# Patient Record
Sex: Female | Born: 1960 | Race: Black or African American | Hispanic: No | Marital: Married | Smoking: Never smoker
Health system: Southern US, Community
[De-identification: ages and names within clinical notes are randomized; demographics above are authoritative.]

## PROBLEM LIST (undated history)

## (undated) DIAGNOSIS — T7840XA Allergy, unspecified, initial encounter: Secondary | ICD-10-CM

## (undated) DIAGNOSIS — F988 Other specified behavioral and emotional disorders with onset usually occurring in childhood and adolescence: Secondary | ICD-10-CM

## (undated) DIAGNOSIS — K0889 Other specified disorders of teeth and supporting structures: Secondary | ICD-10-CM

## (undated) DIAGNOSIS — M1990 Unspecified osteoarthritis, unspecified site: Secondary | ICD-10-CM

## (undated) DIAGNOSIS — I499 Cardiac arrhythmia, unspecified: Secondary | ICD-10-CM

## (undated) DIAGNOSIS — D649 Anemia, unspecified: Secondary | ICD-10-CM

## (undated) DIAGNOSIS — F419 Anxiety disorder, unspecified: Secondary | ICD-10-CM

## (undated) HISTORY — DX: Allergy, unspecified, initial encounter: T78.40XA

## (undated) HISTORY — PX: CHOLECYSTECTOMY: SHX55

## (undated) HISTORY — PX: FOOT SURGERY: SHX648

## (undated) HISTORY — PX: ROUX-EN-Y GASTRIC BYPASS: SHX1104

## (undated) HISTORY — PX: KNEE SURGERY: SHX244

## (undated) HISTORY — PX: HEMORROIDECTOMY: SUR656

---

## 1997-05-12 ENCOUNTER — Other Ambulatory Visit: Admission: RE | Admit: 1997-05-12 | Discharge: 1997-05-12 | Payer: Self-pay | Admitting: *Deleted

## 1997-10-30 ENCOUNTER — Emergency Department (HOSPITAL_COMMUNITY): Admission: EM | Admit: 1997-10-30 | Discharge: 1997-10-30 | Payer: Self-pay | Admitting: Emergency Medicine

## 1999-10-05 ENCOUNTER — Encounter: Payer: Self-pay | Admitting: Emergency Medicine

## 1999-10-05 ENCOUNTER — Emergency Department (HOSPITAL_COMMUNITY): Admission: EM | Admit: 1999-10-05 | Discharge: 1999-10-05 | Payer: Self-pay | Admitting: Emergency Medicine

## 1999-10-18 ENCOUNTER — Other Ambulatory Visit: Admission: RE | Admit: 1999-10-18 | Discharge: 1999-10-18 | Payer: Self-pay | Admitting: *Deleted

## 1999-11-05 ENCOUNTER — Encounter (INDEPENDENT_AMBULATORY_CARE_PROVIDER_SITE_OTHER): Payer: Self-pay | Admitting: Specialist

## 1999-11-05 ENCOUNTER — Ambulatory Visit (HOSPITAL_COMMUNITY): Admission: RE | Admit: 1999-11-05 | Discharge: 1999-11-06 | Payer: Self-pay | Admitting: General Surgery

## 1999-11-05 ENCOUNTER — Encounter (HOSPITAL_BASED_OUTPATIENT_CLINIC_OR_DEPARTMENT_OTHER): Payer: Self-pay | Admitting: General Surgery

## 2001-08-06 ENCOUNTER — Other Ambulatory Visit: Admission: RE | Admit: 2001-08-06 | Discharge: 2001-08-06 | Payer: Self-pay | Admitting: Obstetrics and Gynecology

## 2001-09-20 ENCOUNTER — Inpatient Hospital Stay (HOSPITAL_COMMUNITY): Admission: AD | Admit: 2001-09-20 | Discharge: 2001-09-20 | Payer: Self-pay | Admitting: Obstetrics and Gynecology

## 2001-10-30 ENCOUNTER — Encounter: Payer: Self-pay | Admitting: *Deleted

## 2001-10-30 ENCOUNTER — Ambulatory Visit (HOSPITAL_COMMUNITY): Admission: RE | Admit: 2001-10-30 | Discharge: 2001-10-30 | Payer: Self-pay | Admitting: *Deleted

## 2001-11-01 ENCOUNTER — Inpatient Hospital Stay (HOSPITAL_COMMUNITY): Admission: AD | Admit: 2001-11-01 | Discharge: 2001-11-04 | Payer: Self-pay | Admitting: *Deleted

## 2001-11-05 ENCOUNTER — Inpatient Hospital Stay (HOSPITAL_COMMUNITY): Admission: AD | Admit: 2001-11-05 | Discharge: 2001-11-05 | Payer: Self-pay | Admitting: *Deleted

## 2001-11-06 ENCOUNTER — Encounter (HOSPITAL_COMMUNITY): Admission: RE | Admit: 2001-11-06 | Discharge: 2001-12-06 | Payer: Self-pay | Admitting: *Deleted

## 2001-11-07 ENCOUNTER — Inpatient Hospital Stay (HOSPITAL_COMMUNITY): Admission: AD | Admit: 2001-11-07 | Discharge: 2001-11-07 | Payer: Self-pay | Admitting: *Deleted

## 2001-11-13 ENCOUNTER — Inpatient Hospital Stay (HOSPITAL_COMMUNITY): Admission: AD | Admit: 2001-11-13 | Discharge: 2001-11-15 | Payer: Self-pay | Admitting: *Deleted

## 2001-11-22 ENCOUNTER — Inpatient Hospital Stay (HOSPITAL_COMMUNITY): Admission: AD | Admit: 2001-11-22 | Discharge: 2001-11-22 | Payer: Self-pay | Admitting: *Deleted

## 2001-12-11 ENCOUNTER — Encounter (HOSPITAL_COMMUNITY): Admission: RE | Admit: 2001-12-11 | Discharge: 2002-01-10 | Payer: Self-pay | Admitting: *Deleted

## 2001-12-11 ENCOUNTER — Encounter: Payer: Self-pay | Admitting: *Deleted

## 2001-12-11 ENCOUNTER — Ambulatory Visit (HOSPITAL_COMMUNITY): Admission: RE | Admit: 2001-12-11 | Discharge: 2001-12-11 | Payer: Self-pay | Admitting: *Deleted

## 2002-01-01 ENCOUNTER — Encounter: Payer: Self-pay | Admitting: *Deleted

## 2002-01-15 ENCOUNTER — Encounter (HOSPITAL_COMMUNITY): Admission: AD | Admit: 2002-01-15 | Discharge: 2002-01-22 | Payer: Self-pay | Admitting: Obstetrics and Gynecology

## 2002-01-22 ENCOUNTER — Encounter: Payer: Self-pay | Admitting: Obstetrics and Gynecology

## 2002-01-28 ENCOUNTER — Inpatient Hospital Stay (HOSPITAL_COMMUNITY): Admission: AD | Admit: 2002-01-28 | Discharge: 2002-01-28 | Payer: Self-pay | Admitting: *Deleted

## 2002-02-04 ENCOUNTER — Inpatient Hospital Stay (HOSPITAL_COMMUNITY): Admission: AD | Admit: 2002-02-04 | Discharge: 2002-02-04 | Payer: Self-pay | Admitting: Family Medicine

## 2002-02-05 ENCOUNTER — Encounter (HOSPITAL_COMMUNITY): Admission: RE | Admit: 2002-02-05 | Discharge: 2002-02-15 | Payer: Self-pay | Admitting: *Deleted

## 2002-02-11 ENCOUNTER — Inpatient Hospital Stay (HOSPITAL_COMMUNITY): Admission: AD | Admit: 2002-02-11 | Discharge: 2002-02-11 | Payer: Self-pay | Admitting: Obstetrics and Gynecology

## 2002-02-12 ENCOUNTER — Encounter: Payer: Self-pay | Admitting: *Deleted

## 2002-02-12 ENCOUNTER — Inpatient Hospital Stay (HOSPITAL_COMMUNITY): Admission: AD | Admit: 2002-02-12 | Discharge: 2002-02-12 | Payer: Self-pay | Admitting: *Deleted

## 2002-02-15 ENCOUNTER — Inpatient Hospital Stay (HOSPITAL_COMMUNITY): Admission: AD | Admit: 2002-02-15 | Discharge: 2002-02-18 | Payer: Self-pay | Admitting: *Deleted

## 2002-02-21 ENCOUNTER — Encounter: Admission: RE | Admit: 2002-02-21 | Discharge: 2002-02-21 | Payer: Self-pay | Admitting: *Deleted

## 2002-03-29 ENCOUNTER — Encounter: Admission: RE | Admit: 2002-03-29 | Discharge: 2002-03-29 | Payer: Self-pay | Admitting: *Deleted

## 2002-03-29 ENCOUNTER — Other Ambulatory Visit: Admission: RE | Admit: 2002-03-29 | Discharge: 2002-03-29 | Payer: Self-pay | Admitting: *Deleted

## 2002-03-29 ENCOUNTER — Encounter (INDEPENDENT_AMBULATORY_CARE_PROVIDER_SITE_OTHER): Payer: Self-pay | Admitting: *Deleted

## 2003-02-04 ENCOUNTER — Emergency Department (HOSPITAL_COMMUNITY): Admission: EM | Admit: 2003-02-04 | Discharge: 2003-02-04 | Payer: Self-pay | Admitting: Emergency Medicine

## 2003-08-01 ENCOUNTER — Encounter: Admission: RE | Admit: 2003-08-01 | Discharge: 2003-08-01 | Payer: Self-pay | Admitting: Obstetrics and Gynecology

## 2003-08-01 ENCOUNTER — Encounter (INDEPENDENT_AMBULATORY_CARE_PROVIDER_SITE_OTHER): Payer: Self-pay | Admitting: *Deleted

## 2003-08-05 ENCOUNTER — Ambulatory Visit (HOSPITAL_COMMUNITY): Admission: RE | Admit: 2003-08-05 | Discharge: 2003-08-05 | Payer: Self-pay | Admitting: *Deleted

## 2003-09-12 ENCOUNTER — Encounter: Admission: RE | Admit: 2003-09-12 | Discharge: 2003-09-12 | Payer: Self-pay | Admitting: Family Medicine

## 2003-09-17 ENCOUNTER — Ambulatory Visit (HOSPITAL_COMMUNITY): Admission: RE | Admit: 2003-09-17 | Discharge: 2003-09-17 | Payer: Self-pay | Admitting: *Deleted

## 2003-09-18 ENCOUNTER — Emergency Department (HOSPITAL_COMMUNITY): Admission: EM | Admit: 2003-09-18 | Discharge: 2003-09-18 | Payer: Self-pay | Admitting: Family Medicine

## 2003-09-23 ENCOUNTER — Emergency Department (HOSPITAL_COMMUNITY): Admission: EM | Admit: 2003-09-23 | Discharge: 2003-09-23 | Payer: Self-pay | Admitting: Family Medicine

## 2003-11-03 ENCOUNTER — Emergency Department (HOSPITAL_COMMUNITY): Admission: EM | Admit: 2003-11-03 | Discharge: 2003-11-03 | Payer: Self-pay | Admitting: Family Medicine

## 2004-07-10 ENCOUNTER — Emergency Department (HOSPITAL_COMMUNITY): Admission: EM | Admit: 2004-07-10 | Discharge: 2004-07-10 | Payer: Self-pay | Admitting: Family Medicine

## 2004-08-11 ENCOUNTER — Ambulatory Visit: Payer: Self-pay | Admitting: Hematology & Oncology

## 2004-09-29 ENCOUNTER — Inpatient Hospital Stay (HOSPITAL_COMMUNITY): Admission: EM | Admit: 2004-09-29 | Discharge: 2004-10-02 | Payer: Self-pay | Admitting: Emergency Medicine

## 2004-10-08 ENCOUNTER — Ambulatory Visit: Payer: Self-pay | Admitting: Hematology & Oncology

## 2004-12-15 ENCOUNTER — Ambulatory Visit: Payer: Self-pay | Admitting: Hematology & Oncology

## 2005-03-15 ENCOUNTER — Ambulatory Visit: Payer: Self-pay | Admitting: Hematology & Oncology

## 2005-05-06 ENCOUNTER — Ambulatory Visit (HOSPITAL_COMMUNITY): Admission: RE | Admit: 2005-05-06 | Discharge: 2005-05-06 | Payer: Self-pay | Admitting: Internal Medicine

## 2005-06-12 ENCOUNTER — Ambulatory Visit: Payer: Self-pay | Admitting: Hematology & Oncology

## 2005-06-17 LAB — CHCC SMEAR

## 2005-06-17 LAB — CBC & DIFF AND RETIC
EOS%: 1.1 % (ref 0.0–7.0)
Eosinophils Absolute: 0 10*3/uL (ref 0.0–0.5)
HGB: 13.6 g/dL (ref 11.6–15.9)
IRF: 0.27 (ref 0.130–0.330)
MCH: 27.5 pg (ref 26.0–34.0)
MCV: 83.8 fL (ref 81.0–101.0)
MONO%: 9.8 % (ref 0.0–13.0)
NEUT#: 1.9 10*3/uL (ref 1.5–6.5)
RBC: 4.94 10*6/uL (ref 3.70–5.32)
RDW: 14 % (ref 11.3–14.5)
RETIC #: 45 10*3/uL (ref 19.7–115.1)
Retic %: 0.9 % (ref 0.4–2.3)
lymph#: 1.9 10*3/uL (ref 0.9–3.3)

## 2005-09-15 ENCOUNTER — Ambulatory Visit: Payer: Self-pay | Admitting: Hematology & Oncology

## 2005-09-16 LAB — CBC WITH DIFFERENTIAL/PLATELET
BASO%: 0.3 % (ref 0.0–2.0)
EOS%: 1.1 % (ref 0.0–7.0)
LYMPH%: 39.3 % (ref 14.0–48.0)
MCHC: 32.7 g/dL (ref 32.0–36.0)
MONO#: 0.4 10*3/uL (ref 0.1–0.9)
Platelets: 220 10*3/uL (ref 145–400)
RBC: 4.84 10*6/uL (ref 3.70–5.32)
WBC: 4.3 10*3/uL (ref 3.9–10.0)

## 2005-09-16 LAB — CHCC SMEAR

## 2005-09-19 LAB — VITAMIN B12: Vitamin B-12: >2000 pg/mL — ABNORMAL HIGH (ref 211–911)

## 2005-09-19 LAB — TSH: TSH: 1.623 u[IU]/mL (ref 0.350–5.500)

## 2005-12-14 ENCOUNTER — Ambulatory Visit: Payer: Self-pay | Admitting: Hematology & Oncology

## 2005-12-16 LAB — CBC WITH DIFFERENTIAL/PLATELET
BASO%: 0.3 % (ref 0.0–2.0)
HCT: 41.1 % (ref 34.8–46.6)
MCH: 27.5 pg (ref 26.0–34.0)
MCHC: 32.8 g/dL (ref 32.0–36.0)
MCV: 83.6 fL (ref 81.0–101.0)
MONO#: 0.3 10*3/uL (ref 0.1–0.9)
MONO%: 7.8 % (ref 0.0–13.0)
NEUT#: 2.3 10*3/uL (ref 1.5–6.5)
NEUT%: 52.5 % (ref 39.6–76.8)
Platelets: 217 10*3/uL (ref 145–400)
lymph#: 1.7 10*3/uL (ref 0.9–3.3)

## 2005-12-19 LAB — TRANSFERRIN RECEPTOR, SOLUABLE: Transferrin Receptor, Soluble: 2.5 mg/L (ref 1.9–4.4)

## 2006-04-01 ENCOUNTER — Emergency Department (HOSPITAL_COMMUNITY): Admission: EM | Admit: 2006-04-01 | Discharge: 2006-04-01 | Payer: Self-pay | Admitting: Family Medicine

## 2006-04-11 ENCOUNTER — Ambulatory Visit: Payer: Self-pay | Admitting: Hematology & Oncology

## 2006-09-07 IMAGING — CR DG ABDOMEN ACUTE W/ 1V CHEST
3 series · 3 of 3 positions shown · non-contrast
Comparison: none

CLINICAL DATA: Abdominal pain.  Nausea.  Vomiting. 
 ACUTE ABDOMINAL SERIES - 3 VIEW:
 Supine and erect abdominal radiographs show scattered colonic gas and stool.  There is no evidence of dilated bowel loops.  Multiple surgical clips and staples are seen in the abdomen and pelvis.  There is no evidence of free intraperitoneal air.  No radiopaque calculi are identified.  
 Heart size and mediastinal contours are normal.  Both lungs are clear.

[w chest pa]
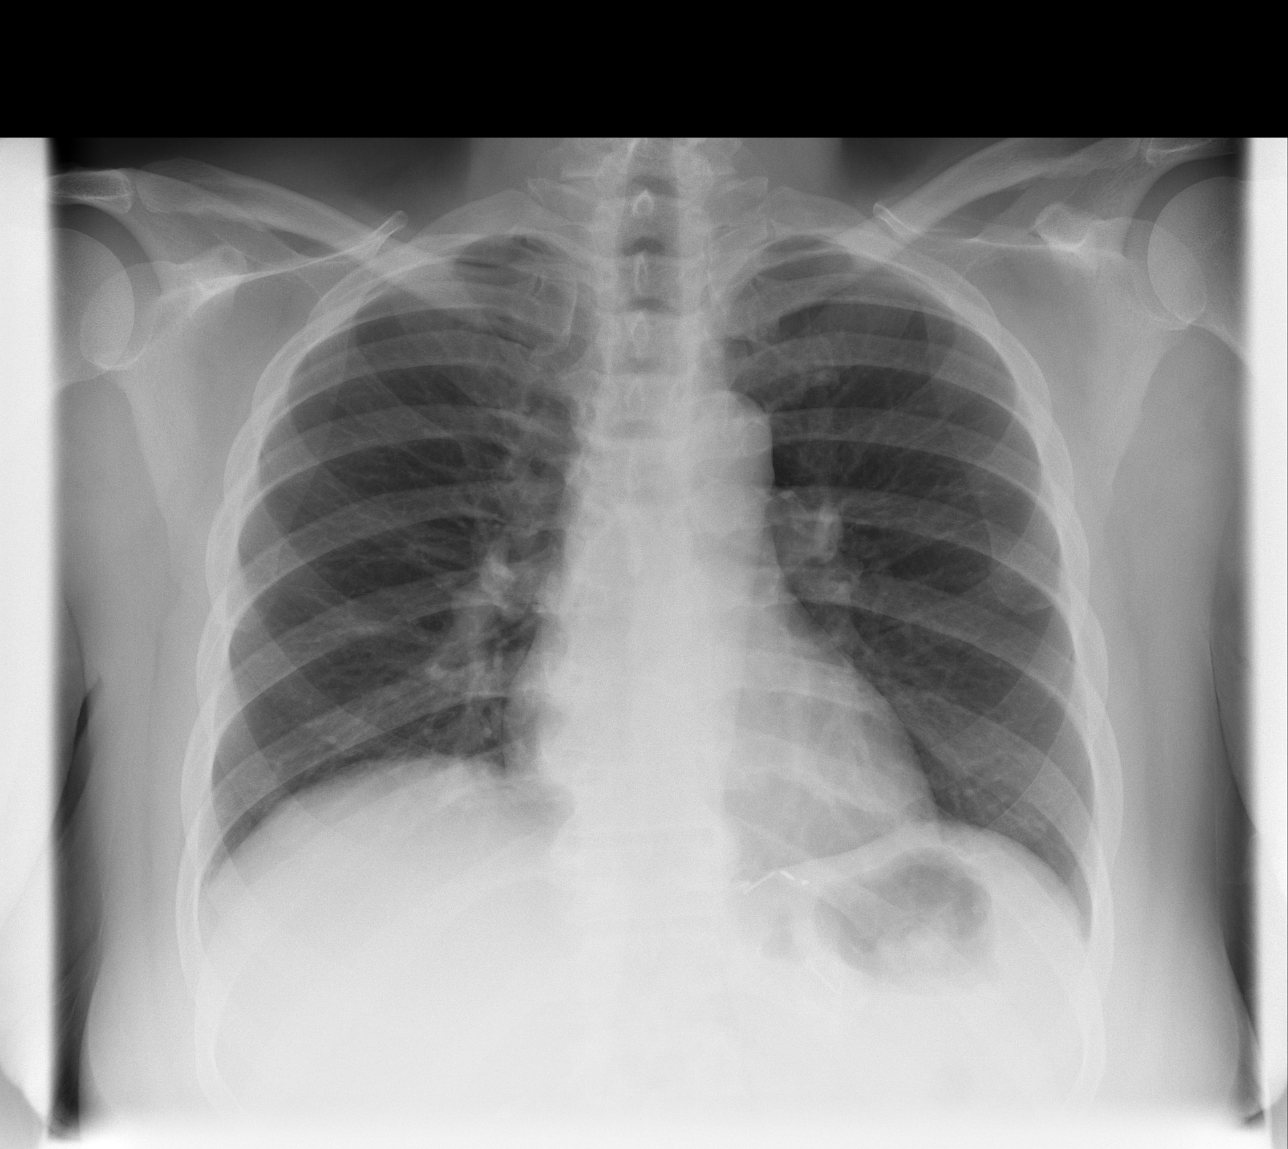

[w abdomen upright]
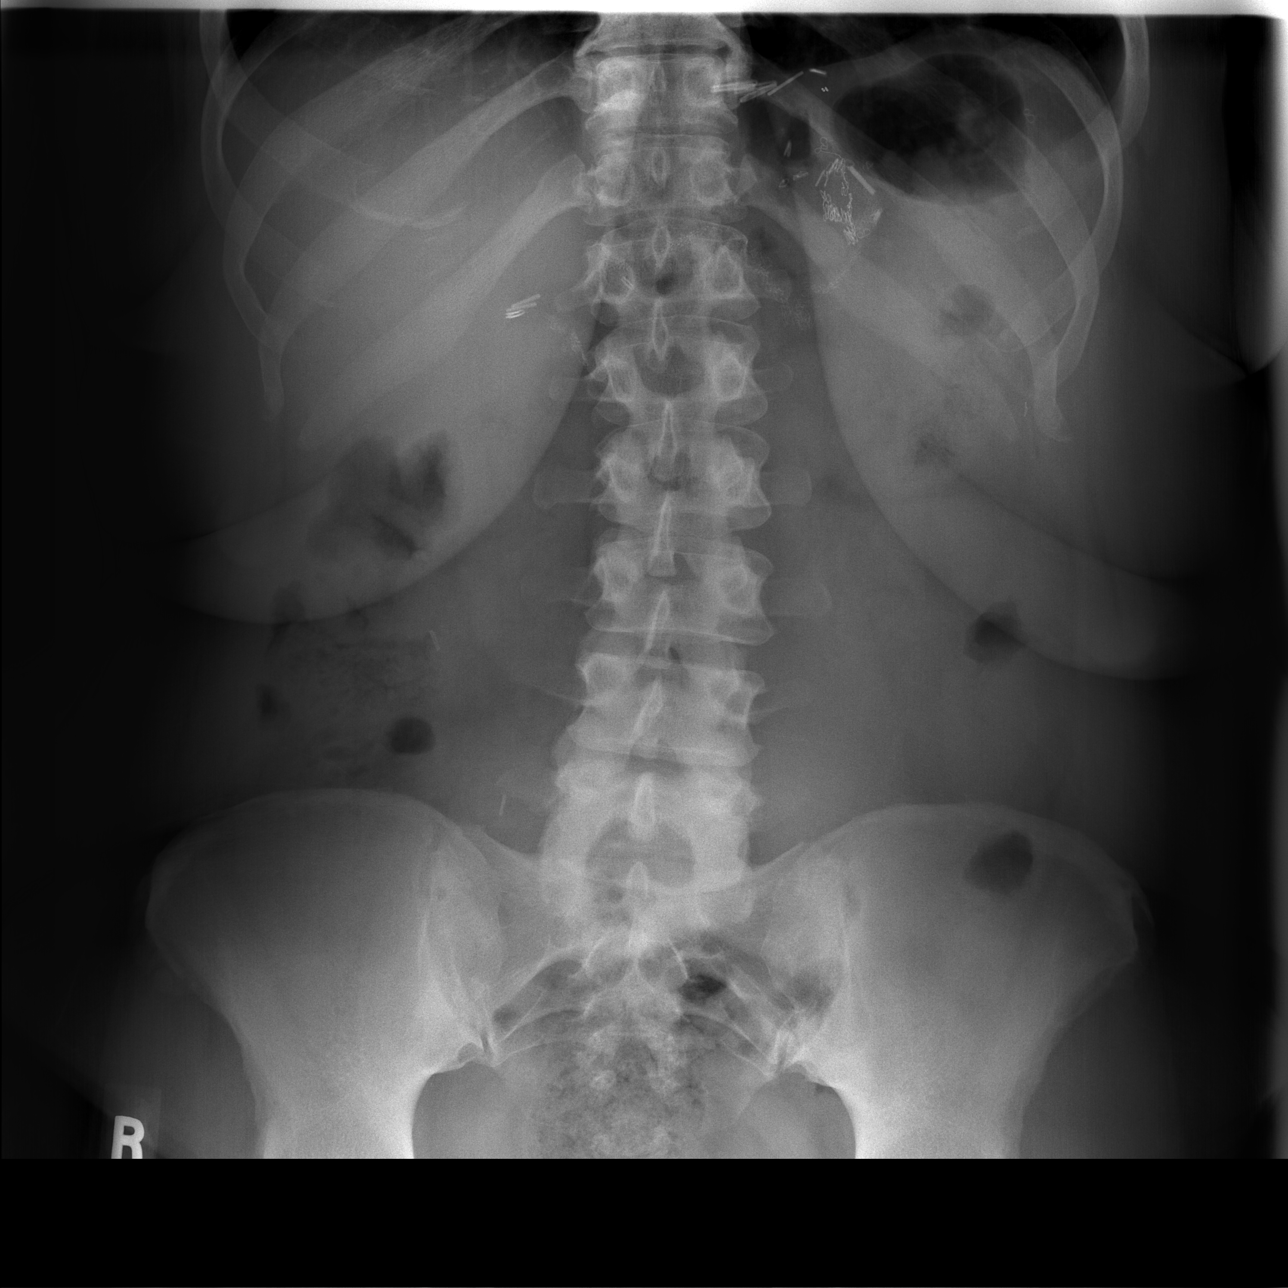

[t abdomen supine]
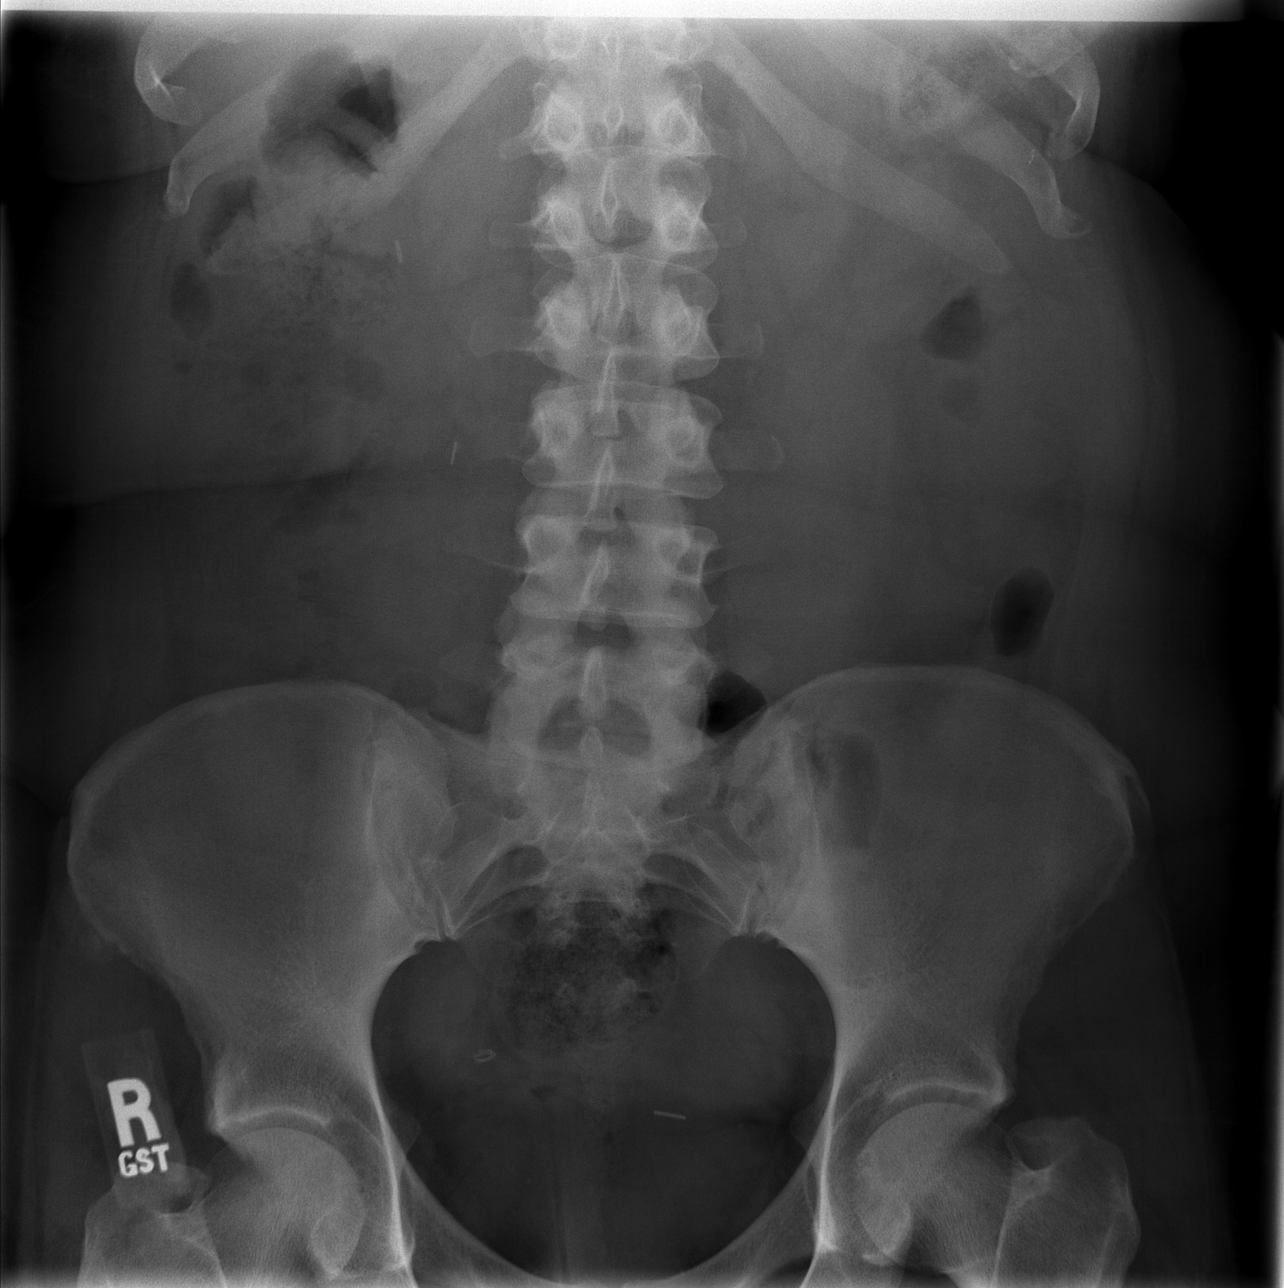

[3 of 3 positions shown; findings below may reference images not displayed]

IMPRESSION: 1.  Normal bowel gas pattern. 
 2.  No active cardiopulmonary disease.

## 2006-09-26 ENCOUNTER — Ambulatory Visit (HOSPITAL_COMMUNITY): Admission: RE | Admit: 2006-09-26 | Discharge: 2006-09-26 | Payer: Self-pay | Admitting: Family Medicine

## 2007-03-30 ENCOUNTER — Emergency Department (HOSPITAL_COMMUNITY): Admission: EM | Admit: 2007-03-30 | Discharge: 2007-03-30 | Payer: Self-pay | Admitting: Emergency Medicine

## 2007-09-13 ENCOUNTER — Encounter: Admission: RE | Admit: 2007-09-13 | Discharge: 2007-09-13 | Payer: Self-pay | Admitting: Occupational Medicine

## 2007-10-24 ENCOUNTER — Encounter: Admission: RE | Admit: 2007-10-24 | Discharge: 2007-10-24 | Payer: Self-pay | Admitting: Occupational Medicine

## 2007-11-19 ENCOUNTER — Encounter: Admission: RE | Admit: 2007-11-19 | Discharge: 2007-11-19 | Payer: Self-pay | Admitting: Internal Medicine

## 2008-04-03 ENCOUNTER — Emergency Department (HOSPITAL_COMMUNITY): Admission: EM | Admit: 2008-04-03 | Discharge: 2008-04-03 | Payer: Self-pay | Admitting: Family Medicine

## 2008-09-18 ENCOUNTER — Emergency Department (HOSPITAL_COMMUNITY): Admission: EM | Admit: 2008-09-18 | Discharge: 2008-09-18 | Payer: Self-pay | Admitting: Family Medicine

## 2008-12-02 ENCOUNTER — Ambulatory Visit (HOSPITAL_COMMUNITY): Admission: RE | Admit: 2008-12-02 | Discharge: 2008-12-02 | Payer: Self-pay | Admitting: Family Medicine

## 2009-03-30 ENCOUNTER — Encounter: Admission: RE | Admit: 2009-03-30 | Discharge: 2009-03-30 | Payer: Self-pay | Admitting: Internal Medicine

## 2009-07-25 ENCOUNTER — Emergency Department (HOSPITAL_COMMUNITY): Admission: EM | Admit: 2009-07-25 | Discharge: 2009-07-25 | Payer: Self-pay | Admitting: Family Medicine

## 2010-02-07 ENCOUNTER — Encounter: Payer: Self-pay | Admitting: Internal Medicine

## 2010-02-07 ENCOUNTER — Encounter: Payer: Self-pay | Admitting: *Deleted

## 2010-02-08 ENCOUNTER — Encounter: Payer: Self-pay | Admitting: Family Medicine

## 2010-02-21 ENCOUNTER — Emergency Department (HOSPITAL_COMMUNITY)
Admission: EM | Admit: 2010-02-21 | Discharge: 2010-02-21 | Disposition: A | Discharge status: Home / Self Care | Payer: Self-pay | Attending: Emergency Medicine | Admitting: Emergency Medicine

## 2010-02-21 ENCOUNTER — Inpatient Hospital Stay (INDEPENDENT_AMBULATORY_CARE_PROVIDER_SITE_OTHER)
Admission: RE | Admit: 2010-02-21 | Discharge: 2010-02-21 | Disposition: A | Discharge status: Home / Self Care | Payer: Self-pay | Source: Ambulatory Visit | Attending: Family Medicine | Admitting: Family Medicine

## 2010-02-21 DIAGNOSIS — H81399 Other peripheral vertigo, unspecified ear: Secondary | ICD-10-CM | POA: Insufficient documentation

## 2010-02-21 DIAGNOSIS — R42 Dizziness and giddiness: Secondary | ICD-10-CM

## 2010-02-21 DIAGNOSIS — F988 Other specified behavioral and emotional disorders with onset usually occurring in childhood and adolescence: Secondary | ICD-10-CM | POA: Insufficient documentation

## 2010-02-21 DIAGNOSIS — Z79899 Other long term (current) drug therapy: Secondary | ICD-10-CM | POA: Insufficient documentation

## 2010-02-21 DIAGNOSIS — R110 Nausea: Secondary | ICD-10-CM | POA: Insufficient documentation

## 2010-02-21 LAB — POCT I-STAT, CHEM 8
BUN: 9 mg/dL (ref 6–23)
Creatinine, Ser: 0.9 mg/dL (ref 0.4–1.2)
Glucose, Bld: 110 mg/dL — ABNORMAL HIGH (ref 70–99)
Hemoglobin: 15.3 g/dL — ABNORMAL HIGH (ref 12.0–15.0)

## 2010-06-04 NOTE — Discharge Summary (Signed)
[name], Latasha Evans                 ACCOUNT NO.:  [id number]   MEDICAL RECORD NO.:  [id number]          PATIENT TYPE:  INP   LOCATION:  5729                         FACILITY:  [facility]   PHYSICIAN:  Danae Chen, M.D.DATE OF BIRTH:  [birth year]   DATE OF ADMISSION:  09/29/2004  DATE OF DISCHARGE:  10/02/2004                                 DISCHARGE SUMMARY   Dr. Ulyess Mort dictating for the [facility] Service.   DISCHARGE DIAGNOSES:  1.  Partial small bowel obstruction, resolved.  2.  Microcytic anemia.  3.  Status post gastric bypass surgery.  4.  Liver lesions on CT scan, hemangiomas verses occult tumors.   DISCHARGE MEDICATIONS:  Nexium 40 mg p.o. daily.   PERTINENT HOSPITAL COURSE:  Patient is a pleasant 50 year old African-  American female status post gastric bypass surgery who presented with  complaints of abdominal fullness and mild nausea and vomiting after  consuming a large piece of steak the day prior to admission.  The patient  was found to have a small bowel obstruction per CT and was admitted for  bowel rest and hydration.  By hospital day #2 her nausea and vomiting  symptoms had improved and NG tube was discontinued.  By hospital day #3 she  had advanced her diet, was passing gas and had bowel movements and had no  residual nausea, vomiting or abdominal pain.  Patient is informed that she  needs to have a followup of her liver scan done, liver done with an MRI, as  she does have some liver lesions.  She is current asymptomatic with no  elevations of liver function test.  She also has microcytic anemia of iron  deficiency and will continue to use a multivitamin with iron.   CONDITION ON DISCHARGE:  Improved, afebrile, blood pressure 105/71.  Abdomen  is soft, nontender with active bowel sounds.  Heart rate is regular, she has  no peripheral edema.      Danae Chen, M.D.  Electronically Signed     RLK/MEDQ  D:  10/02/2004  T:   10/03/2004  Job:  [id number]

## 2010-06-04 NOTE — Discharge Summary (Signed)
NAME:  AVIYANNA, COLBAUGH                             ACCOUNT NO.:  [id number]   MEDICAL RECORD NO.:  [id number]                   PATIENT TYPE:  INP   LOCATION:  9152                                 [facility]ACILITY:  [facility]   PHYSICIAN:  Mary Sella. Orlene Erm, M.D.                 DATE OF BIRTH:  1960/12/16   DATE OF ADMISSION:  11/13/2001  DATE OF DISCHARGE:  11/15/2001                                 DISCHARGE SUMMARY   DISCHARGE DIAGNOSES:  1. Intrauterine pregnancy at 23 and 0.  2. Preterm contractions versus preterm labor.  3. Advanced maternal age.  4. Status post gastric bypass.   DISCHARGE MEDICATIONS:  1. Terbutaline 2.5 mg p.o. q.4h.  2. Prenatal vitamins one p.o. q.d.   PROCEDURES AND DIAGNOSTIC STUDIES:  None during this hospitalization.   ADMISSION HISTORY AND PHYSICAL:  A 50 year old G3, P1-1-0-1 at 42 and 3  admitted with contractions for 12 hours despite the use of Procardia.  The  patient's pregnancy is complicated by a history of fetal demise with twins  at [redacted] weeks gestation, history of gastric bypass, and advanced maternal age.  Other medical problems include anemia, lactose intolerance.   PRENATAL LABORATORIES:  B+.  Antibody negative.  Rubella immune.  Hepatitis  B surface antigen negative.  RPR negative.  GC negative.  Chlamydia  negative.  GBS positive.   HOSPITAL COURSE:  The patient was admitted for continuous tocolysis and  fetal monitoring and was on bed rest with bathroom privileges.  She resolved  her contractions during her hospitalization.  Had minimal uterine  irritability on the day before discharge and resolved that as well.  The  patient was on Unasyn throughout her hospitalization for GBS positivity.  The patient did complain of vaginal discharge prior to discharge and a wet  prep showed few wbc's with moderate bacteria, but no yeast, trich, or BV.  Vaginal examination showed a 2 cm external os, fingertip internal os,  moderately thick with a developing lower  uterine segment, but high station.  Upon discussion with Dr. Orlene Erm this is unchanged from previous examinations.  The patient resolved her contractions on p.o. terbutaline 2.5 mg q.4h. and  did not have any evidence of cervical change and therefore was deemed  appropriate for discharge.   DISCHARGE LABORATORIES:  UA with trace LE.  Micro:  0-2 wbc's, rare squams.  Urine culture reincubated for better growth.  Wet prep with no yeast,  trichomonads, or clue cells, few wbc's, and moderate bacteria.   The patient was discharged home without further incident and will remain on  bed rest at home as she has been here in hospital.     Jonah Blue, M.D.                      Mary Sella. Orlene Erm, M.D.    Milas Gain  D:  11/15/2001  T:  11/15/2001  Job:  [id number]   cc:   [facility]

## 2010-06-04 NOTE — Group Therapy Note (Signed)
NAME:  Latasha Evans, Latasha Evans NO.:  [id number]   MEDICAL RECORD NO.:  [id number]                   PATIENT TYPE:  OUT   LOCATION:  [facility]                           FACILITY:  [facility]   PHYSICIAN:  Tinnie Gens, MD                     DATE OF BIRTH:  May 02, 1960   DATE OF SERVICE:  08/01/2003                                    CLINIC NOTE   CHIEF COMPLAINT:  Physical.   HISTORY OF PRESENT ILLNESS:  The patient is a 50 year old gravida 3 para 2-0-  1-2 who is here today for a physical exam.  She has not been examined since  her postpartum check from her last pregnancy which was 2 years ago.  The  patient reports that she has not had a period for the last 3-4 months but  she has been spotting occasionally.  The patient has no specific pain that  she identifies.  The patient has been seen by [facility] and has had some workup for not having a period although she does  not know the results of those tests.   The patient is concerned she has had a gastric bypass and her weight has  stabled out at 227.4 although she does note that her diet is not as good as  it used to be, nor is she exercising as well.   Her past medical, surgical, family, and social history are reviewed and are  unchanged.   REVIEW OF SYSTEMS:  Reviewed and negative except for in the HPI.   PHYSICAL EXAMINATION TODAY:  VITAL SIGNS:  The patient's vital signs are as  noted in the chart.  Her weight is 227.4.  BREASTS:  Symmetric, pendulous.  There is no mass.  There is no axillary or  supraclavicular adenopathy.  ABDOMEN:  Soft, nondistended, good bowel sounds.  GENITOURINARY:  She has normal external female genitalia.  The vagina is  pink and rugated without lesions.  Cervix is parous.  There was no cervical  motion tenderness.  There were no lesions on the cervix.  The uterus is  about 8 weeks size though this was limited by body habitus.  There was no  adnexal mass or  tenderness appreciated.   IMPRESSION:  1. Physical examination.  2. Amenorrhea.  Could be related to perimenopause.  3. Obesity.   PLAN:  1. Pap smear today.  2. Try to obtain labs from the family practice center.  3. Advised at length about diet and exercise.                                               Tinnie Gens, MD   TP/MEDQ  D:  08/05/2003  T:  08/05/2003  Job:  [id number]

## 2010-06-04 NOTE — H&P (Signed)
NAME:  Latasha Evans, Latasha Evans                 ACCOUNT NO.:  [id number]   MEDICAL RECORD NO.:  [id number]          PATIENT TYPE:  EMS   LOCATION:  MAJO                         FACILITY:  [facility]   PHYSICIAN:  Lonia Blood, M.D.DATE OF BIRTH:  06-08-1960   DATE OF ADMISSION:  09/29/2004  DATE OF DISCHARGE:                                HISTORY & PHYSICAL   CHIEF COMPLAINT:  Weakness and abdominal cramps.   HISTORY OF PRESENT ILLNESS:  Ms. Latasha Evans is a very pleasant 50 year old  female with a significant history of gastric bypass surgery in 2000.  She  had been in her usual state of health until the day prior to admission.  At  lunch time on September 28, 2004 the patient ate a steak for lunch.  She  reports that she made every effort to chew the steak completely, but is  concerned that she may have swallowed some larger pieces.  By 6 p.m. on  September 28, 2004 she began to experience significant bilateral upper  abdominal quadrant crampy type pain.  The pain passed straight through her  abdomen into her back.  Pain escalated throughout the evening.  The pain  interrupted her sleep at night.  Her symptoms were much worse upon wakening  on September 29, 2004.  At that time she began to develop severe nausea and  shortly thereafter vomiting began.  Patient vomited multiple times at home.  After development of vomiting and with significant increase in pain patient  presented to the emergency room for further evaluation.  In the emergency  room a CT scan of the abdomen has been obtained which reveals small bowel  obstruction in the proximal small bowel.  Patient has received pain  medications and sedatives and is presently resting comfortably.   REVIEW OF SYSTEMS:  Comprehensive review of systems is entirely negative  with the exception to positive elements of history of present illness above.  It should be noted that patient has reported an approximate 30 pound weight  loss over multiple  months but this has, in fact, been intentional per her  report with modification of diet and exercise.   OUTPATIENT MEDICATIONS:  1.  Nexium 40 mg daily.  2.  Multiple vitamins over-the-counter.   ALLERGIES:  CODEINE.  The patient reports that she is LACTOSE intolerant,  but does not have true anaphylactic symptoms with milk products.   FAMILY HISTORY:  The patient's mother is alive and has high blood pressure,  diabetes.  The patient's father is alive and has high blood pressure and  diabetes.  The patient has two brothers who are both reported to be healthy.  There is no significant history of cancer in her immediate family.   SOCIAL HISTORY:  Patient is married.  She lives in [place].  She has two  children ages 59 and 60.  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  She does not  smoke.  She occasionally drinks alcohol, but consumes less than six drinks  per week.   DATA REVIEWED:  Hemoglobin is 11.8 with MCV of 74.  White count is normal.  Platelets are normal.  Urine pregnancy is negative.  Electrolytes are  normal.  Creatinine is normal.  LFTs are normal.  Albumin is 4.  Urinalysis  reveals 15 ketones, but is otherwise negative.  Amylase and lipase are both  normal.  Chest x-ray reveals no acute disease.  CT scan of the abdomen  reveals multiple lesions within the liver which are worrisome for metastatic  disease, though these could simply represent hemangiomas.  There is a  proximal small bowel with small bowel obstruction appreciable.  There is  overall a concern of GI malignancy based upon the findings of the CT scan.   PHYSICAL EXAMINATION:  VITAL SIGNS:  Temperature 98.1, blood pressure  122/81, heart rate 73, respiratory rate 16, O2 saturation 100% on room air.  GENERAL:  Obese, well-developed female in no acute respiratory distress who  does appear to be in clear abdominal pain.  HEENT:  Normocephalic, atraumatic.  Pupils are equal, round, and reactive to  light and  accommodation.  Extraocular muscles intact bilaterally.  OC/OP  clear.  NECK:  No JVD.  No lymphadenopathy.  No thyromegaly.  LUNGS:  Clear to auscultation bilaterally without wheezes or rhonchi.  CARDIOVASCULAR:  Regular rate and rhythm without murmurs, rubs, or gallops.  Normal S1 and S2.  ABDOMEN:  Patient is very tender across the entire abdomen.  The abdomen is  soft and not distended.  Bowel sounds are not able to be appreciated.  There  is no focal mass but deep examination is not able to be accomplished due to  severe pain.  There is no obvious fluid wave.  EXTREMITIES:  No clubbing, cyanosis, edema bilateral lower extremities.  CUTANEOUS:  No apparent skin lesions are appreciable.  NEUROLOGIC:  Cranial nerves II-XII are intact bilaterally.  Patient is alert  and oriented x4.  She has 5/5 strength throughout bilateral upper and lower  extremities.  She is intact to sensation and touch throughout.  There is no  Babinski.   IMPRESSION AND PLAN:  1.  Small bowel obstruction.  This may very well represent a complication of      steak consumption in a patient who is status post a gastric bypass      surgery.  It is not clear to me what particular surgery she had for her      bypass, but certainly consumption of solid foods such as steak with the      potential for swallowing large pieces can be a complicating factor for      patients like Ms. Yetta Barre.  For now she does not have an acute abdomen and      there is no indication for surgical intervention.  We will manage her      symptoms with intravenous anti-emetics, intravenous pain medicines, and      with an NG tube for gastric decompression.  Proton-pump inhibitor will      be administered on a daily basis.  Bowel movements will be stimulated      from below using suppositories or enemas as appropriate.  Should the      patient fail to progress, general surgery consult will then be carried     out to consider surgical intervention.   Certainly wish to avoid this,      however, as the patient has already had one major abdominal surgery and      the risk for adhesion formation will increase with each subsequent  surgery.  2.  Hepatic lesions on CT scan.  These are, in fact, worrisome.  However,      the patient has no constitutional symptoms to suggest a metastatic      cancer.  Her microcytic anemia can easily be explained by her unusual      menstrual periods.  Nonetheless, this is an issue that must be followed      up.  I will obtain MRI of the abdomen and will sedate the patient for      such with Ativan given her admission that she is extremely      claustrophobic.  I do feel that this is a worthwhile endeavor, however,      as gastrointestinal malignancy could also explain the small bowel      obstruction.  3.  Microcytic anemia.  Patient has a hemoglobin of 11.8 with an MCV of 74.      I will not obtain iron studies as I am sure this is truly an iron      deficiency anemia.  Patient does have a history of extremely heavy      menstrual bleeding for three months followed by periods of no bleeding      whatsoever.  She is followed by a gynecologist (Dr. Okey Dupre at [facility]).  I will certainly not initiate hormone therapy of any kind      during this hospital      stay and will defer treatment of her abnormal menstrual bleeding to her      gynecologist.  In regard to the patient's anemia I will simply follow      her hemoglobin and not pursue further work-up unless clearly indicated      by MRI as detailed above.      Lonia Blood, M.D.  Electronically Signed     JTM/MEDQ  D:  09/29/2004  T:  09/29/2004  Job:  [id number]   cc:   Candyce Churn. Allyne Gee, M.D.  Fax: [phone]

## 2010-06-04 NOTE — Op Note (Signed)
[place]. [facility]  Patient:    Latasha Evans, Latasha Evans                         MRN: [id number] Proc. Date: 11/05/99 Adm. Date:  [id number] Attending:  Fortino Sic                           Operative Report  PREOPERATIVE DIAGNOSIS:  Probable fissure in ano, hemorrhoids.  POSTOPERATIVE DIAGNOSES: 1. Probable fissure in ano. 2. Hemorrhoids. 3. Sphincter spasm.  OPERATION: 1. Proctosigmoidoscopy to 18 cm. 2. Excision hemorrhoid. 3. Lateral sphincterotomy. 4. Rubber banding of internal hemorrhoid.  SURGEON:  Marnee Spring. Wiliam Ke, M.D.  ASSISTANT:  None.  ANESTHESIA:  Endotracheal  DESCRIPTION OF PROCEDURE:  Under good endotracheal anesthesia, with the patient in the dorsal lithotomy position, proctosigmoidoscopy was performed to 18 cm which was negative.  The findings were as follows:  The sphincter muscle was very very tight.  In the posterior mid line there was a thrombus and a sentinel polyp but I did not see a fissure in ano.  There was a large internal prolapsing hemorrhoid in the right anterolateral quadrant.  Using the bullet retractor, tissues in the posterior midline were injected with Marcaine anesthesia with epinephrine.  A 2-0 chromic suture was placed at the dentate line and a large internal and external hemorrhoid bundle along with the area of induration was excised.  This wound was then closed using the 2-0 silk suture in a running interlocking fashion.  A left lateral sphincterotomy was performed.  The tissue was infiltrated with Marcaine anesthesia. An incision was made from the dentate line to the anal verge.  The internal sphincter was brought up into the wound and divided with electrocautery cutter.  This wound was then closed with running interlocking 2-0 chromic catgut.  The patient had a large prolapsing internal hemorrhoid in the right anterolateral area.  This was treated by pulling on the hemorrhoid bundle and placing two tight  rubber bands at its base.  At this point, the outlet was good.  Hemostasis was excellent.  The patient was taken down from dorsal lithotomy position.  The wound was dressed with Dibucaine ointment.  Estimated blood loss for the procedure was minimal.  The patient received no blood and left the operating room in satisfactory condition after sponge and needle counts were verified. DD:  11/05/99 TD:  11/06/99 Job: [id number]

## 2010-06-04 NOTE — Discharge Summary (Signed)
. [facility]  Patient:    Latasha Evans, Latasha Evans                         MRN: [id number] Adm. Date:  [id number] Disc. Date: [id number] Attending:  Fortino Sic                           Discharge Summary  ADMISSION DIAGNOSIS:  Fissure-in-ano.  DISCHARGE DIAGNOSIS:  Internal and external hemorrhoids.  OPERATION PERFORMED:  On November 05, 1999, proctoscopic excision of hemorrhoids, sphincterotomy, and rubber band internal hemorrhoid.  ADMITTING DATA:  This is a 50 year old female with bright red bleeding in the stools and painful bowel movements.  Admission laboratory and x-ray data noncontributory.  HOSPITAL COURSE:  The patient was admitted postop.  Her postoperative course was completely benign.  At the time of discharge, she is up and about, voiding and tolerating a diet.  DISCHARGE CONDITION:  Stable.  DIET:  As tolerated.  ACTIVITY:  Ambulation as tolerated with sitz bath.  DISCHARGE MEDICATIONS: Include Metamucil, Surfak, ______, Vicodin, and Restoril.  FOLLOWUP:  She is to see me in five to six days. DD:  11/06/99 TD:  11/07/99 Job: [id number]

## 2010-06-04 NOTE — Discharge Summary (Signed)
   NAMEMarland Evans  [name], [name]                             ACCOUNT NO.:  [id number]   MEDICAL RECORD NO.:  [id number]                   PATIENT TYPE:  INP   LOCATION:  9143                                 [facility]ACILITY[facility]   PHYSICIAN:  Mary Sella. Orlene Erm, M.D.                 DATE OF BIRTH:  1960-08-11   DATE OF ADMISSION:  11/01/2001  DATE OF DISCHARGE:  11/04/2001                                 DISCHARGE SUMMARY   DISCHARGE MEDICATION:  Prenatal vitamins p.o. q.d.   SPECIAL INSTRUCTIONS:  She was discharged with information on signs of  preterm labor and when to call her physician.   ACTIVITY:  She will be on bedrest with no sexual activity or anything that  might make her have an orgasm for the rest of her pregnancy.   FOLLOW UP:  She is to continue her care with Dr. Gavin Potters here at [facility].   DISPOSITION:  She is being discharged to home.   HOSPITAL COURSE:  The patient is a 50 year old, G3, P1-0-1-1 who presented  at 20-5/7 weeks by dates with an estimated due date of February 27, 2002.  The patient came in with a chief complaint of cramping.  On electronic fetal  monitoring, it was apparent that the patient was having contractions every  two to four minutes.  The patient was known to have a negative ANA and a  negative lupus anticoagulant.  The patient was admitted and started on IV  Unasyn for positive Group B Streptococcus culture.  After being started on  Unasyn, the contractions stopped.  The patient still had occasional  intermittent contractions at which time we would use fetal monitoring.  The  patient would occasionally have contractions, but most often just exhibited  uterine irritability.   On the day of discharge, the patient said that she had felt some lower  uterine pelvic pressure.  Tocolysis was done showing questionable to  possible contractions over an hour's period of time, not consistent with the  patient's feelings of pressure.  The patient will follow up  with Dr. Gavin Potters  and continue to have prenatal care as scheduled.     Nani Gasser, M.D.                  Mary Sella. Orlene Erm, M.D.    CM/MEDQ  D:  11/04/2001  T:  11/05/2001  Job:  [id number]

## 2010-06-15 ENCOUNTER — Ambulatory Visit: Payer: Self-pay

## 2010-06-15 ENCOUNTER — Other Ambulatory Visit: Payer: Self-pay | Admitting: Occupational Medicine

## 2010-06-15 DIAGNOSIS — R52 Pain, unspecified: Secondary | ICD-10-CM

## 2012-01-24 ENCOUNTER — Other Ambulatory Visit (HOSPITAL_COMMUNITY): Payer: Self-pay | Admitting: Physician Assistant

## 2012-01-24 DIAGNOSIS — Z1231 Encounter for screening mammogram for malignant neoplasm of breast: Secondary | ICD-10-CM

## 2012-02-03 ENCOUNTER — Ambulatory Visit (HOSPITAL_COMMUNITY)
Admission: RE | Admit: 2012-02-03 | Discharge: 2012-02-03 | Disposition: A | Discharge status: Home / Self Care | Payer: Managed Care, Other (non HMO) | Source: Ambulatory Visit | Attending: Physician Assistant | Admitting: Physician Assistant

## 2012-02-03 DIAGNOSIS — Z1231 Encounter for screening mammogram for malignant neoplasm of breast: Secondary | ICD-10-CM | POA: Insufficient documentation

## 2012-04-23 NOTE — Progress Notes (Signed)
Need orders placed in EPIC for surgery on 05/07/12.  Preop appointment on 04/30/12 at 1000am.  Thanks.

## 2012-04-24 ENCOUNTER — Encounter (HOSPITAL_COMMUNITY): Payer: Self-pay | Admitting: Pharmacy Technician

## 2012-04-27 ENCOUNTER — Encounter: Payer: Self-pay | Admitting: Cardiology

## 2012-04-27 NOTE — Patient Instructions (Addendum)
Latasha Evans  04/27/2012   Your procedure is scheduled on: 05/07/12    Report to [facility] at   1115 AM  Call this number if you have problems the morning of surgery: [phone]   Remember:   Do not eat food after midnite Sunday NIGHT. May have clear liquids Monday  until 0745am then nothing by mouth.    Take these medicines the morning of surgery with A SIP OF WATER:   TRAMADOL IF NEEDED   Do not wear jewelry, make-up or nail polish.  Do not wear lotions, powders, or perfumes.  Do not shave 48 hours prior to surgery.   Do not bring valuables to the hospital.  Contacts, dentures or bridgework may not be worn into surgery.  Leave suitcase in the car. After surgery it may be brought to your room.  For patients admitted to the hospital, checkout time is 11:00 AM the day of  discharge.        SEE CHG INSTRUCTION SHEET    Please read over the following fact sheets that you were given: MRSA Information, coughing and deep breathing exercises, leg exercises, Blood Transfusion Fact sheet, Incentive Spirometry Fact Sheet                Failure to comply with these instructions may result in cancellation of your surgery.                Patient Signature ____________________________              Nurse Signature _____________________________

## 2012-04-27 NOTE — Progress Notes (Signed)
  HPI:   Current Outpatient Prescriptions  Medication Sig Dispense Refill  . amphetamine-dextroamphetamine (ADDERALL XR) 25 MG 24 hr capsule Take 50 mg by mouth every morning.      Marland Kitchen b complex vitamins tablet Take 1 tablet by mouth daily.      Marland Kitchen CALCIUM CITRATE-VITAMIN D3 PO Take 4 tablets by mouth 2 (two) times daily.      . fluticasone (FLONASE) 50 MCG/ACT nasal spray Place 2 sprays into the nose daily as needed for allergies.      Marland Kitchen glucosamine-chondroitin 500-400 MG tablet Take 3 tablets by mouth every morning.      . Multiple Vitamin (MULTIVITAMIN WITH MINERALS) TABS Take 1 tablet by mouth at bedtime.      . naproxen (NAPRELAN) 500 MG 24 hr tablet Take 500 mg by mouth daily with breakfast.      . sertraline (ZOLOFT) 50 MG tablet Take 50 mg by mouth at bedtime.      . traMADol-acetaminophen (ULTRACET) 37.5-325 MG per tablet Take 1 tablet by mouth every 6 (six) hours as needed for pain.      . traZODone (DESYREL) 50 MG tablet Take 150 mg by mouth at bedtime.      . vitamin C (ASCORBIC ACID) 500 MG tablet Take 500 mg by mouth at bedtime.      . Vitamin D, Ergocalciferol, (DRISDOL) 50000 UNITS CAPS Take 50,000 Units by mouth every Tuesday.      . vitamin E 400 UNIT capsule Take 400 Units by mouth daily.       No current facility-administered medications for this visit.    Allergies  Allergen Reactions  . Codeine Nausea And Vomiting  . Adhesive (Tape) Itching and Rash    No past medical history on file.  No past surgical history on file.  History   Social History  . Marital Status: Married    Spouse Name: N/A    Number of Children: N/A  . Years of Education: N/A   Occupational History  . Not on file.   Social History Main Topics  . Smoking status: Not on file  . Smokeless tobacco: Not on file  . Alcohol Use: Not on file  . Drug Use: Not on file  . Sexually Active: Not on file   Other Topics Concern  . Not on file   Social History Narrative  . No narrative on file      No family history on file.  ROS: no fevers or chills, productive cough, hemoptysis, dysphasia, odynophagia, melena, hematochezia, dysuria, hematuria, rash, seizure activity, orthopnea, PND, pedal edema, claudication. Remaining systems are negative.  Physical Exam:   There were no vitals taken for this visit.  General:  Well developed/well nourished in NAD Skin warm/dry Patient not depressed No peripheral clubbing Back-normal HEENT-normal/normal eyelids Neck supple/normal carotid upstroke bilaterally; no bruits; no JVD; no thyromegaly chest - CTA/ normal expansion CV - RRR/normal S1 and S2; no murmurs, rubs or gallops;  PMI nondisplaced Abdomen -NT/ND, no HSM, no mass, + bowel sounds, no bruit 2+ femoral pulses, no bruits Ext-no edema, chords, 2+ DP Neuro-grossly nonfocal  ECG    This encounter was created in error - please disregard.

## 2012-04-30 ENCOUNTER — Encounter (HOSPITAL_COMMUNITY)
Admission: RE | Admit: 2012-04-30 | Discharge: 2012-04-30 | Disposition: A | Discharge status: Home / Self Care | Payer: Worker's Compensation | Source: Ambulatory Visit | Attending: Orthopedic Surgery | Admitting: Orthopedic Surgery

## 2012-04-30 ENCOUNTER — Encounter (HOSPITAL_COMMUNITY): Payer: Self-pay

## 2012-04-30 ENCOUNTER — Ambulatory Visit (HOSPITAL_COMMUNITY)
Admission: RE | Admit: 2012-04-30 | Discharge: 2012-04-30 | Disposition: A | Discharge status: Home / Self Care | Payer: Managed Care, Other (non HMO) | Source: Ambulatory Visit | Attending: Orthopedic Surgery | Admitting: Orthopedic Surgery

## 2012-04-30 DIAGNOSIS — Z01818 Encounter for other preprocedural examination: Secondary | ICD-10-CM | POA: Insufficient documentation

## 2012-04-30 DIAGNOSIS — M1710 Unilateral primary osteoarthritis, unspecified knee: Secondary | ICD-10-CM | POA: Insufficient documentation

## 2012-04-30 HISTORY — DX: Anemia, unspecified: D64.9

## 2012-04-30 HISTORY — DX: Cardiac arrhythmia, unspecified: I49.9

## 2012-04-30 HISTORY — DX: Unspecified osteoarthritis, unspecified site: M19.90

## 2012-04-30 HISTORY — DX: Other specified behavioral and emotional disorders with onset usually occurring in childhood and adolescence: F98.8

## 2012-04-30 HISTORY — DX: Other specified disorders of teeth and supporting structures: K08.89

## 2012-04-30 HISTORY — DX: Anxiety disorder, unspecified: F41.9

## 2012-04-30 LAB — URINALYSIS, ROUTINE W REFLEX MICROSCOPIC
Glucose, UA: NEGATIVE mg/dL
Hgb urine dipstick: NEGATIVE
Leukocytes, UA: NEGATIVE
pH: 7 (ref 5.0–8.0)

## 2012-04-30 LAB — BASIC METABOLIC PANEL
BUN: 10 mg/dL (ref 6–23)
Calcium: 9 mg/dL (ref 8.4–10.5)
Chloride: 107 mEq/L (ref 96–112)
Creatinine, Ser: 1.01 mg/dL (ref 0.50–1.10)
GFR calc Af Amer: 74 mL/min — ABNORMAL LOW (ref >90)

## 2012-04-30 LAB — ABO/RH: ABO/RH(D): B POS

## 2012-04-30 LAB — CBC
HCT: 39.4 % (ref 36.0–46.0)
MCHC: 32.2 g/dL (ref 30.0–36.0)
Platelets: 258 10*3/uL (ref 150–400)
RDW: 15 % (ref 11.5–15.5)

## 2012-04-30 LAB — SURGICAL PCR SCREEN: MRSA, PCR: NEGATIVE

## 2012-04-30 LAB — APTT: aPTT: 39 seconds — ABNORMAL HIGH (ref 24–37)

## 2012-04-30 LAB — PROTIME-INR: INR: 0.91 (ref 0.00–1.49)

## 2012-04-30 NOTE — Progress Notes (Signed)
OV Dr Leeann Must with EKG, STRESS TEST and report 3/14 on chart.  Stated at PST visit has broken tooth that is hurting her and was seen by dentist and told needs root canal.  Instructed to inform Matt at visit on 05/03/12. Verbalized understanding

## 2012-05-04 ENCOUNTER — Encounter: Payer: Self-pay | Admitting: Cardiology

## 2012-05-04 NOTE — Progress Notes (Signed)
Called pt home number, work number, and cell phone to inform pt of surgical time change for Monday 05/07/12.  Unable to reach pt at any of those numbers.  All numbers just rang & rang, unable to leave message at any of the numbers.

## 2012-05-04 NOTE — Progress Notes (Signed)
Called patient and informed her of time change for 05/07/12. Patient is to arrive at 0730 to Short Stay. She verbalizes understanding.

## 2012-05-06 NOTE — H&P (Signed)
TOTAL KNEE ADMISSION H&P  Patient is being admitted for right lateal unicompartmental knee arthroplasty.  Subjective:  Chief Complaint:   Right knee lateral compartment pain.  HPI: Latasha Evans, 52 y.o. female, has a history of pain and functional disability in the right knee due to arthritis and has failed non-surgical conservative treatments for greater than 12 weeks to includeNSAID's and/or analgesics, corticosteriod injections, viscosupplementation injections, use of assistive devices and activity modification.  Onset of symptoms was gradual, starting 2 years ago with gradually worsening course since that time. The patient noted prior procedures on the knee to include  arthroscopy on the right knee(s).  Patient currently rates pain in the right knee(s) at 9 out of 10 with activity. Patient has night pain, worsening of pain with activity and weight bearing, pain that interferes with activities of daily living, pain with passive range of motion, crepitus and joint swelling.  Patient has evidence of periarticular osteophytes and joint space narrowing in the lateral compartment by imaging studies. There is no active infection.  Risks, benefits and expectations were discussed with the patient. Patient understand the risks, benefits and expectations and wishes to proceed with surgery.   D/C Plans:   Home with HHPT  Post-op Meds:    Rx given for ASA, Zanaflex, Celebrex, Iron, Colace and MiraLax  Tranexamic Acid:   To be given  Decadron:    To be given  FYI:    Gluten intolerance   Past Medical History  Diagnosis Date  . ADD (attention deficit disorder)   . Anxiety   . Dysrhythmia     RBBB  . Anemia     before menopause  . Arthritis   . Toothache     broken molar - per dentist needs root canal    Past Surgical History  Procedure Laterality Date  . Roux-en-y gastric bypass    . Cholecystectomy    . Knee surgery Right   . Foot surgery Right     hammertoe correction with repair  fracture second toe    Allergies  Allergen Reactions  . Codeine Nausea And Vomiting  . Adhesive (Tape) Itching and Rash    History  Substance Use Topics  . Smoking status: Never Smoker   . Smokeless tobacco: Never Used  . Alcohol Use: Yes     Comment: socially- wine       Review of Systems  Constitutional: Negative.   HENT: Negative.   Eyes: Negative.   Respiratory: Negative.   Cardiovascular: Negative.   Gastrointestinal: Negative.   Genitourinary: Negative.   Musculoskeletal: Positive for joint pain.  Skin: Negative.   Neurological: Negative.   Endo/Heme/Allergies: Negative.   Psychiatric/Behavioral: Negative.     Objective:  Physical Exam  Constitutional: She is oriented to person, place, and time. She appears well-developed and well-nourished.  HENT:  Head: Normocephalic and atraumatic.  Mouth/Throat: Oropharynx is clear and moist.  Eyes: Pupils are equal, round, and reactive to light.  Neck: Neck supple. No JVD present. No tracheal deviation present. No thyromegaly present.  Cardiovascular: Intact distal pulses.  An irregular rhythm present.  Respiratory: Effort normal and breath sounds normal. No stridor. No respiratory distress. She has no wheezes.  GI: Soft. There is no tenderness. There is no guarding.  Musculoskeletal:       Right knee: She exhibits decreased range of motion, swelling and bony tenderness. She exhibits no ecchymosis, no deformity, no laceration and no erythema. Tenderness found. Lateral joint line tenderness noted. No medial joint  line tenderness noted.  Lymphadenopathy:    She has no cervical adenopathy.  Neurological: She is alert and oriented to person, place, and time.  Skin: Skin is warm and dry.  Psychiatric: She has a normal mood and affect.     Imaging Review Plain radiographs demonstrate severe degenerative joint disease of the right knee, lateral compartment. The overall alignment is  neutral. The bone quality appears to be  good for age and reported activity level.  Assessment/Plan:  End stage arthritis, right knee, lateral compartment.  The patient history, physical examination, clinical judgment of the provider and imaging studies are consistent with end stage degenerative joint disease of the right knee(s) and total knee arthroplasty is deemed medically necessary. The treatment options including medical management, injection therapy arthroscopy and arthroplasty were discussed at length. The risks and benefits of total knee arthroplasty were presented and reviewed. The risks due to aseptic loosening, infection, stiffness, patella tracking problems, thromboembolic complications and other imponderables were discussed. The patient acknowledged the explanation, agreed to proceed with the plan and consent was signed. Patient is being admitted for inpatient treatment for surgery, pain control, PT, OT, prophylactic antibiotics, VTE prophylaxis, progressive ambulation and ADL's and discharge planning. The patient is planning to be discharged home with home health services.    Latasha Evans   PAC  05/06/2012, 10:38 AM

## 2012-05-07 ENCOUNTER — Encounter (HOSPITAL_COMMUNITY)
Admission: RE | Disposition: A | Discharge status: Home Health | Payer: Self-pay | Source: Ambulatory Visit | Attending: Orthopedic Surgery

## 2012-05-07 ENCOUNTER — Encounter (HOSPITAL_COMMUNITY): Payer: Self-pay

## 2012-05-07 ENCOUNTER — Encounter (HOSPITAL_COMMUNITY): Payer: Self-pay | Admitting: Anesthesiology

## 2012-05-07 ENCOUNTER — Inpatient Hospital Stay (HOSPITAL_COMMUNITY): Payer: Worker's Compensation | Admitting: Anesthesiology

## 2012-05-07 ENCOUNTER — Inpatient Hospital Stay (HOSPITAL_COMMUNITY)
Admission: RE | Admit: 2012-05-07 | Discharge: 2012-05-09 | DRG: 470 | Disposition: A | Discharge status: Home Health | Payer: Worker's Compensation | Source: Ambulatory Visit | Attending: Orthopedic Surgery | Admitting: Orthopedic Surgery

## 2012-05-07 DIAGNOSIS — Z01812 Encounter for preprocedural laboratory examination: Secondary | ICD-10-CM

## 2012-05-07 DIAGNOSIS — E669 Obesity, unspecified: Secondary | ICD-10-CM | POA: Diagnosis present

## 2012-05-07 DIAGNOSIS — Z96651 Presence of right artificial knee joint: Secondary | ICD-10-CM

## 2012-05-07 DIAGNOSIS — E871 Hypo-osmolality and hyponatremia: Secondary | ICD-10-CM | POA: Diagnosis not present

## 2012-05-07 DIAGNOSIS — Z6838 Body mass index (BMI) 38.0-38.9, adult: Secondary | ICD-10-CM

## 2012-05-07 DIAGNOSIS — D500 Iron deficiency anemia secondary to blood loss (chronic): Secondary | ICD-10-CM | POA: Diagnosis not present

## 2012-05-07 DIAGNOSIS — Z9884 Bariatric surgery status: Secondary | ICD-10-CM

## 2012-05-07 DIAGNOSIS — M1710 Unilateral primary osteoarthritis, unspecified knee: Principal | ICD-10-CM | POA: Diagnosis present

## 2012-05-07 DIAGNOSIS — D62 Acute posthemorrhagic anemia: Secondary | ICD-10-CM | POA: Diagnosis not present

## 2012-05-07 HISTORY — PX: PARTIAL KNEE ARTHROPLASTY: SHX2174

## 2012-05-07 SURGERY — ARTHROPLASTY, KNEE, UNICOMPARTMENTAL
Anesthesia: Spinal | Site: Knee | Laterality: Right | Wound class: Clean

## 2012-05-07 MED ORDER — METOCLOPRAMIDE HCL 5 MG/ML IJ SOLN
5.0000 mg | Freq: Three times a day (TID) | INTRAMUSCULAR | Status: DC | PRN
  Administered 2012-05-07: 10 mg via INTRAVENOUS
  Filled 2012-05-07: qty 2

## 2012-05-07 MED ORDER — FERROUS SULFATE 325 (65 FE) MG PO TABS
325.0000 mg | ORAL_TABLET | Freq: Three times a day (TID) | ORAL | Status: DC
  Administered 2012-05-08 – 2012-05-09 (×3): 325 mg via ORAL
  Filled 2012-05-07 (×8): qty 1

## 2012-05-07 MED ORDER — TRAZODONE HCL 150 MG PO TABS
150.0000 mg | ORAL_TABLET | Freq: Every day | ORAL | Status: DC
Start: 2012-05-07 — End: 2012-05-09
  Administered 2012-05-07 – 2012-05-08 (×2): 150 mg via ORAL
  Filled 2012-05-07 (×3): qty 1

## 2012-05-07 MED ORDER — TRANEXAMIC ACID 100 MG/ML IV SOLN
1000.0000 mg | Freq: Once | INTRAVENOUS | Status: AC
  Administered 2012-05-07: 1000 mg via INTRAVENOUS
  Filled 2012-05-07: qty 10

## 2012-05-07 MED ORDER — AMPHETAMINE-DEXTROAMPHET ER 25 MG PO CP24: 50.0000 mg | ORAL_CAPSULE | ORAL | Status: DC

## 2012-05-07 MED ORDER — SALINE FLUSH 0.9 % IV SOLN
INTRAVENOUS | Status: DC | PRN
  Administered 2012-05-07: 50 mL

## 2012-05-07 MED ORDER — BUPIVACAINE LIPOSOME 1.3 % IJ SUSP
20.0000 mL | Freq: Once | INTRAMUSCULAR | Status: DC
  Filled 2012-05-07: qty 20

## 2012-05-07 MED ORDER — POLYETHYLENE GLYCOL 3350 17 G PO PACK
17.0000 g | PACK | Freq: Two times a day (BID) | ORAL | Status: DC
  Administered 2012-05-07 – 2012-05-09 (×4): 17 g via ORAL

## 2012-05-07 MED ORDER — DIPHENHYDRAMINE HCL 25 MG PO CAPS
25.0000 mg | ORAL_CAPSULE | Freq: Four times a day (QID) | ORAL | Status: DC | PRN
  Administered 2012-05-07: 25 mg via ORAL
  Filled 2012-05-07: qty 1

## 2012-05-07 MED ORDER — HYDROCODONE-ACETAMINOPHEN 7.5-325 MG PO TABS
1.0000 | ORAL_TABLET | ORAL | Status: DC
  Administered 2012-05-07 (×3): 1 via ORAL
  Administered 2012-05-08: 2 via ORAL
  Administered 2012-05-08: 1 via ORAL
  Administered 2012-05-08 (×3): 2 via ORAL
  Filled 2012-05-07 (×5): qty 2
  Filled 2012-05-07: qty 1
  Filled 2012-05-07 (×2): qty 2
  Filled 2012-05-07: qty 1

## 2012-05-07 MED ORDER — FENTANYL CITRATE 0.05 MG/ML IJ SOLN
INTRAMUSCULAR | Status: DC | PRN
  Administered 2012-05-07 (×4): 25 ug via INTRAVENOUS

## 2012-05-07 MED ORDER — AMPHETAMINE-DEXTROAMPHET ER 30 MG PO CP24: 50.0000 mg | ORAL_CAPSULE | ORAL | Status: DC

## 2012-05-07 MED ORDER — BUPIVACAINE HCL (PF) 0.75 % IJ SOLN
INTRAMUSCULAR | Status: DC | PRN
  Administered 2012-05-07: 15 mg

## 2012-05-07 MED ORDER — ONDANSETRON HCL 4 MG/2ML IJ SOLN
4.0000 mg | Freq: Four times a day (QID) | INTRAMUSCULAR | Status: DC | PRN
  Administered 2012-05-07: 4 mg via INTRAVENOUS
  Filled 2012-05-07: qty 2

## 2012-05-07 MED ORDER — DOCUSATE SODIUM 100 MG PO CAPS
100.0000 mg | ORAL_CAPSULE | Freq: Two times a day (BID) | ORAL | Status: DC
  Administered 2012-05-07 – 2012-05-09 (×4): 100 mg via ORAL

## 2012-05-07 MED ORDER — DEXAMETHASONE SODIUM PHOSPHATE 10 MG/ML IJ SOLN: 10.0000 mg | Freq: Once | INTRAMUSCULAR | Status: DC

## 2012-05-07 MED ORDER — CEFAZOLIN SODIUM-DEXTROSE 2-3 GM-% IV SOLR
2.0000 g | Freq: Four times a day (QID) | INTRAVENOUS | Status: AC
  Administered 2012-05-07 (×2): 2 g via INTRAVENOUS
  Filled 2012-05-07 (×2): qty 50

## 2012-05-07 MED ORDER — ALUM & MAG HYDROXIDE-SIMETH 200-200-20 MG/5ML PO SUSP: 30.0000 mL | ORAL | Status: DC | PRN

## 2012-05-07 MED ORDER — PHENOL 1.4 % MT LIQD: 1.0000 | OROMUCOSAL | Status: DC | PRN

## 2012-05-07 MED ORDER — SODIUM CHLORIDE 0.9 % IJ SOLN
INTRAMUSCULAR | Status: DC | PRN
  Administered 2012-05-07: 11:00:00

## 2012-05-07 MED ORDER — MEPERIDINE HCL 50 MG/ML IJ SOLN: 6.2500 mg | INTRAMUSCULAR | Status: DC | PRN

## 2012-05-07 MED ORDER — PROPOFOL 10 MG/ML IV BOLUS
INTRAVENOUS | Status: DC | PRN
  Administered 2012-05-07 (×2): 20 mg via INTRAVENOUS

## 2012-05-07 MED ORDER — METHOCARBAMOL 100 MG/ML IJ SOLN: 500.0000 mg | Freq: Four times a day (QID) | INTRAVENOUS | Status: DC | PRN

## 2012-05-07 MED ORDER — FLUTICASONE PROPIONATE 50 MCG/ACT NA SUSP
2.0000 | Freq: Every day | NASAL | Status: DC | PRN
  Filled 2012-05-07: qty 16

## 2012-05-07 MED ORDER — SODIUM CHLORIDE 0.9 % IV SOLN
INTRAVENOUS | Status: DC
  Administered 2012-05-07: 16:00:00 via INTRAVENOUS
  Filled 2012-05-07 (×6): qty 1000

## 2012-05-07 MED ORDER — HYDROMORPHONE HCL PF 1 MG/ML IJ SOLN
0.2500 mg | INTRAMUSCULAR | Status: DC | PRN
  Administered 2012-05-07 (×3): 0.5 mg via INTRAVENOUS

## 2012-05-07 MED ORDER — CELECOXIB 200 MG PO CAPS
200.0000 mg | ORAL_CAPSULE | Freq: Two times a day (BID) | ORAL | Status: DC
  Administered 2012-05-07 – 2012-05-09 (×4): 200 mg via ORAL
  Filled 2012-05-07 (×5): qty 1

## 2012-05-07 MED ORDER — CEFAZOLIN SODIUM-DEXTROSE 2-3 GM-% IV SOLR
2.0000 g | INTRAVENOUS | Status: AC
  Administered 2012-05-07: 2 g via INTRAVENOUS

## 2012-05-07 MED ORDER — DEXAMETHASONE SODIUM PHOSPHATE 10 MG/ML IJ SOLN
INTRAMUSCULAR | Status: DC | PRN
  Administered 2012-05-07: 10 mg via INTRAVENOUS

## 2012-05-07 MED ORDER — MENTHOL 3 MG MT LOZG: 1.0000 | LOZENGE | OROMUCOSAL | Status: DC | PRN

## 2012-05-07 MED ORDER — HYDROMORPHONE HCL PF 1 MG/ML IJ SOLN
0.5000 mg | INTRAMUSCULAR | Status: DC | PRN
  Administered 2012-05-07: 1 mg via INTRAVENOUS
  Filled 2012-05-07: qty 1

## 2012-05-07 MED ORDER — SERTRALINE HCL 50 MG PO TABS
50.0000 mg | ORAL_TABLET | Freq: Every day | ORAL | Status: DC
  Administered 2012-05-07 – 2012-05-08 (×2): 50 mg via ORAL
  Filled 2012-05-07 (×3): qty 1

## 2012-05-07 MED ORDER — RIVAROXABAN 10 MG PO TABS
10.0000 mg | ORAL_TABLET | ORAL | Status: DC
  Administered 2012-05-08 – 2012-05-09 (×2): 10 mg via ORAL
  Filled 2012-05-07 (×3): qty 1

## 2012-05-07 MED ORDER — METHOCARBAMOL 500 MG PO TABS
500.0000 mg | ORAL_TABLET | Freq: Four times a day (QID) | ORAL | Status: DC | PRN
  Administered 2012-05-08 (×2): 500 mg via ORAL
  Filled 2012-05-07 (×2): qty 1

## 2012-05-07 MED ORDER — METOCLOPRAMIDE HCL 10 MG PO TABS
5.0000 mg | ORAL_TABLET | Freq: Three times a day (TID) | ORAL | Status: DC | PRN
  Administered 2012-05-08: 10 mg via ORAL
  Filled 2012-05-07: qty 2

## 2012-05-07 MED ORDER — DEXAMETHASONE SODIUM PHOSPHATE 10 MG/ML IJ SOLN
10.0000 mg | Freq: Once | INTRAMUSCULAR | Status: AC
  Administered 2012-05-08: 10 mg via INTRAVENOUS
  Filled 2012-05-07: qty 1

## 2012-05-07 MED ORDER — ONDANSETRON HCL 4 MG PO TABS
4.0000 mg | ORAL_TABLET | Freq: Four times a day (QID) | ORAL | Status: DC | PRN
  Administered 2012-05-08: 4 mg via ORAL
  Filled 2012-05-07: qty 1

## 2012-05-07 MED ORDER — ZOLPIDEM TARTRATE 5 MG PO TABS: 5.0000 mg | ORAL_TABLET | Freq: Every evening | ORAL | Status: DC | PRN

## 2012-05-07 MED ORDER — PROPOFOL INFUSION 10 MG/ML OPTIME
INTRAVENOUS | Status: DC | PRN
  Administered 2012-05-07: 50 ug/kg/min via INTRAVENOUS

## 2012-05-07 MED ORDER — FLEET ENEMA 7-19 GM/118ML RE ENEM: 1.0000 | ENEMA | Freq: Once | RECTAL | Status: AC | PRN

## 2012-05-07 MED ORDER — BISACODYL 10 MG RE SUPP: 10.0000 mg | Freq: Every day | RECTAL | Status: DC | PRN

## 2012-05-07 MED ORDER — MIDAZOLAM HCL 5 MG/5ML IJ SOLN
INTRAMUSCULAR | Status: DC | PRN
  Administered 2012-05-07: 2 mg via INTRAVENOUS

## 2012-05-07 MED ORDER — ACETAMINOPHEN 10 MG/ML IV SOLN: 1000.0000 mg | Freq: Once | INTRAVENOUS | Status: DC | PRN

## 2012-05-07 MED ORDER — PROMETHAZINE HCL 25 MG/ML IJ SOLN: 6.2500 mg | INTRAMUSCULAR | Status: DC | PRN

## 2012-05-07 MED ORDER — ACETAMINOPHEN 10 MG/ML IV SOLN
INTRAVENOUS | Status: DC | PRN
  Administered 2012-05-07: 1000 mg via INTRAVENOUS

## 2012-05-07 MED ORDER — LACTATED RINGERS IV SOLN
INTRAVENOUS | Status: DC
  Administered 2012-05-07 (×2): via INTRAVENOUS

## 2012-05-07 SURGICAL SUPPLY — 49 items
BAG ZIPLOCK 12X15 (MISCELLANEOUS) ×2 IMPLANT
BANDAGE ELASTIC 6 VELCRO ST LF (GAUZE/BANDAGES/DRESSINGS) ×2 IMPLANT
BANDAGE ESMARK 6X9 LF (GAUZE/BANDAGES/DRESSINGS) ×1 IMPLANT
BLADE SAW RECIPROCATING 77.5 (BLADE) ×2 IMPLANT
BLADE SAW SGTL 13.0X1.19X90.0M (BLADE) ×2 IMPLANT
BNDG ESMARK 6X9 LF (GAUZE/BANDAGES/DRESSINGS) ×2
BOWL SMART MIX CTS (DISPOSABLE) ×2 IMPLANT
CEMENT HV SMART SET (Cement) ×2 IMPLANT
CLOTH BEACON ORANGE TIMEOUT ST (SAFETY) ×2 IMPLANT
COVER SURGICAL LIGHT HANDLE (MISCELLANEOUS) ×2 IMPLANT
CUFF TOURN SGL QUICK 34 (TOURNIQUET CUFF) ×1
CUFF TRNQT CYL 34X4X40X1 (TOURNIQUET CUFF) ×1 IMPLANT
DERMABOND ADVANCED (GAUZE/BANDAGES/DRESSINGS) ×1
DERMABOND ADVANCED .7 DNX12 (GAUZE/BANDAGES/DRESSINGS) ×1 IMPLANT
DRAPE EXTREMITY T 121X128X90 (DRAPE) ×2 IMPLANT
DRAPE POUCH INSTRU U-SHP 10X18 (DRAPES) ×2 IMPLANT
DRSG AQUACEL AG ADV 3.5X 6 (GAUZE/BANDAGES/DRESSINGS) ×2 IMPLANT
DRSG TEGADERM 4X4.75 (GAUZE/BANDAGES/DRESSINGS) ×2 IMPLANT
DURAPREP 26ML APPLICATOR (WOUND CARE) ×4 IMPLANT
ELECT REM PT RETURN 9FT ADLT (ELECTROSURGICAL) ×2
ELECTRODE REM PT RTRN 9FT ADLT (ELECTROSURGICAL) ×1 IMPLANT
EVACUATOR 1/8 PVC DRAIN (DRAIN) ×2 IMPLANT
FACESHIELD LNG OPTICON STERILE (SAFETY) ×8 IMPLANT
GAUZE SPONGE 2X2 8PLY STRL LF (GAUZE/BANDAGES/DRESSINGS) ×1 IMPLANT
GLOVE BIOGEL PI IND STRL 7.5 (GLOVE) ×1 IMPLANT
GLOVE BIOGEL PI IND STRL 8 (GLOVE) IMPLANT
GLOVE BIOGEL PI INDICATOR 7.5 (GLOVE) ×1
GLOVE BIOGEL PI INDICATOR 8 (GLOVE)
GLOVE ORTHO TXT STRL SZ7.5 (GLOVE) ×4 IMPLANT
GOWN BRE IMP PREV XXLGXLNG (GOWN DISPOSABLE) ×4 IMPLANT
GOWN STRL NON-REIN LRG LVL3 (GOWN DISPOSABLE) ×2 IMPLANT
KIT BASIN OR (CUSTOM PROCEDURE TRAY) ×2 IMPLANT
LEGGING LITHOTOMY PAIR STRL (DRAPES) ×2 IMPLANT
MANIFOLD NEPTUNE II (INSTRUMENTS) ×2 IMPLANT
MARKER SKIN DUAL TIP RULER LAB (MISCELLANEOUS) ×2 IMPLANT
NDL SAFETY ECLIPSE 18X1.5 (NEEDLE) ×1 IMPLANT
NEEDLE HYPO 18GX1.5 SHARP (NEEDLE) ×1
PACK TOTAL JOINT (CUSTOM PROCEDURE TRAY) ×2 IMPLANT
POSITIONER SURGICAL ARM (MISCELLANEOUS) ×2 IMPLANT
SPONGE GAUZE 2X2 STER 10/PKG (GAUZE/BANDAGES/DRESSINGS) ×1
SUCTION FRAZIER TIP 10 FR DISP (SUCTIONS) ×2 IMPLANT
SUT MNCRL AB 4-0 PS2 18 (SUTURE) ×2 IMPLANT
SUT VIC AB 1 CT1 36 (SUTURE) ×6 IMPLANT
SUT VIC AB 2-0 CT1 27 (SUTURE) ×2
SUT VIC AB 2-0 CT1 TAPERPNT 27 (SUTURE) ×2 IMPLANT
SYR 50ML LL SCALE MARK (SYRINGE) ×2 IMPLANT
TOWEL OR 17X26 10 PK STRL BLUE (TOWEL DISPOSABLE) ×4 IMPLANT
TRAY FOLEY CATH 14FRSI W/METER (CATHETERS) ×2 IMPLANT
WRAP KNEE MAXI GEL POST OP (GAUZE/BANDAGES/DRESSINGS) ×2 IMPLANT

## 2012-05-07 NOTE — Op Note (Signed)
NAMEMarland Kitchen  Latasha, Evans NO.:  [id number]  MEDICAL RECORD NO.:  [id number]  LOCATION:  1617                         FACILITY:  [facility]  PHYSICIAN:  Madlyn Frankel. Charlann Boxer, M.D.  DATE OF BIRTH:  [birth year]  DATE OF PROCEDURE:  05/07/2012 DATE OF DISCHARGE:                              OPERATIVE REPORT   PREOPERATIVE DIAGNOSIS:  Right knee lateral compartment osteoarthritis.  POSTOPERATIVE DIAGNOSIS:  Right knee lateral compartment osteoarthritis.  PROCEDURE:  Right lateral unicompartmental knee replacement utilizing a Biomet Vanguard knee system with a size medium femur and a size B.  The lateral fixed bearing insert with a size 4 fixed bearing insert.  SURGEON:  [name], MD.  ASSISTANT:  Lanney Gins, PA-C.  Note:  Latasha Evans was present for the entirety of the case from preoperative position, perioperative management, and the operative extremity, general facilitation of the case, and wound closure.  ANESTHESIA:  Spinal.  SPECIMENS:  None.  COMPLICATIONS:  None.  DRAINS:  One medium Hemovac, tourniquet time was 51 minute at 250 mmHg.  INDICATIONS FOR PROCEDURE:  The patient is a 52 year old female, who was referred through Workers' Comp evaluation for progressive and worsening right lateral knee symptoms, arthroscopic photos as well as radiographs revealed progressive degenerative change of the lateral part of her knee.  She was sent over for evaluation of options including partial knee replacement.  We reviewed total knee replacement versus partial knee replacement in terms of the retention of the anterior cruciate ligament, providing a more stable and normal knee functionally.  The risks of failure of this procedure need to convert to a total knee replacement were all reviewed.  A consent was obtained for lateral partial knee replacement.  The risks of infection, DVT were discussed as well.  PROCEDURE IN DETAIL:  The patient was brought to the  operative theater. Once adequate anesthesia, preoperative antibiotics, Ancef administered, the patient was positioned supine with the right leg over the Oxford leg holder.  A proximal thigh tourniquet was utilized.  The right lower extremity was then prepped and draped in sterile fashion.  A time-out was performed identifying the patient, planned procedure, and extremity.  The leg was exsanguinated.  Tourniquet elevated to 250 mmHg.  Lateral midline incision was used just proximal of the patella down to the tibial tubercle.  Soft tissue planes created a lateral arthrotomy was then made, allowing for subluxation of the patella.  Once the knee was exposed including synovectomy and a slight lateral release of the proximal most aspect of the tibia.  Retractors were placed and attention was now directed to the preparation of the tibia. This finger was best fit with the size medium spoon with it in place. The size 3 g clamp was then inserted and the extramedullary tibial guide positioned accordingly in parallel to the tibial spine.  It was pinned into position.  I then used a reciprocating saw.  Then, the oscillating saw removed the femoral or the tibial bone.  With this in place, the tibial cut seemed to be best fit with a size C trial which amounted to a B final insert.  With this, a size 4 Feeler gauge felt appropriate as this was a  small size available in terms of thickness for this fixed bearing prosthesis.  Once this was done, attention was now directed to the femur.  The femoral canal was opened with a drill, followed by the starting awl, then intramedullary rod was then passed by hand easily.  Utilizing the appropriate femoral jig, it was aligned along the center portion of the lateral femoral condyle and oriented using the Biomet Oxford knee set guides.  The two drill holes were made in the medium and the jig for median posterior cut was then tamped into position, the posterior  cut was made.  I then selected initially a size 4 spigot and milled the distal femur removing the bone.  When I did a trial reduction with the medium femur and the C trial insert, that I needed her to that there was an increased tension and extension more so than flexion, for that reason, I removed the trial components, placed a 5 spigot milled, the distal femur removed, and excessive bone and reached out.  At this point, I was satisfied with the stability of the ligaments from 20 degrees of flexion to 90 degrees of flexion.  Given these findings, I removed the trial femoral component and then pinned the tibial tray in place.  Then, using a reciprocating saw, created a trough for the keel component.  A trial reduction was carried out with a trial component, used medium femur in the size 4 fixed bearing trial insert.  With this, I was happy with the range of motion, stability ligaments with full knee extension and flexion.  At this point, trials were removed.  The sclerotic bone was drilled with the drill to allow for cement interdigitation.  The knee was irrigated normal saline solution and synovial lining joint injected with Exparel.  Following irrigation of the wound, the cement was mixed.  Final components were cemented in place.  First, with the tibial component and then the femoral component, removing excessive cement.  Knee was held at 45 degrees of flexion.  Holding the tibial tray and insert until the cement fully cured.  Once, the cement had cured, excess cement was removed throughout the knee.  I placed a medium Hemovac drain deep.  The tourniquet had been let down after 51 minute without significant hemostasis required.  The extensor mechanism was then reapproximated using a combination of #1 Vicryl in a running #0 V-Loc suture.  The remaining wound was closed with 2-0 Vicryl and running 4-0 Monocryl.  The knee was cleaned, dried, dressed, sterilely using Aquacel and  the drain site was dressed separately.  She was then brought to the recovery room in stable condition, tolerated the procedure well.  Most likely, she will return home tomorrow with home health physical therapy, work on motion strengthening.     Madlyn Frankel Charlann Boxer, M.D.     MDO/MEDQ  D:  05/07/2012  T:  05/07/2012  Job:  [id number]

## 2012-05-07 NOTE — Brief Op Note (Signed)
05/07/2012  12:41 PM  PATIENT:  Latasha Evans  52 y.o. female  PRE-OPERATIVE DIAGNOSIS:  Right Knee Lateral Osteoarthritis  POST-OPERATIVE DIAGNOSIS:  Right Knee Lateral Osteoarthritis  PROCEDURE:  Procedure(s): RIGHT LATERAL UNICOMPARTMENTAL KNEE (Right)  SURGEON:  Surgeon(s) and Role:    * [name], MD - Primary  PHYSICIAN ASSISTANT: Lanney Gins, PA-C  ANESTHESIA:   spinal  EBL:  Total I/O In: 2410 [I.V.:2300; IV Piggyback:110] Out: 400 [Urine:300; Blood:100]  BLOOD ADMINISTERED:none  DRAINS: (1 medium) Hemovact drain(s) in the right knee with  Suction Open   LOCAL MEDICATIONS USED:  OTHER Exparel  SPECIMEN:  No Specimen  DISPOSITION OF SPECIMEN:  N/A  COUNTS:  YES  TOURNIQUET:   Total Tourniquet Time Documented: Thigh (Right) - 52 minutes Total: Thigh (Right) - 52 minutes   DICTATION: .Other Dictation: Dictation Number [id number]  PLAN OF CARE: Admit to inpatient   PATIENT DISPOSITION:  PACU - hemodynamically stable.   Delay start of Pharmacological VTE agent (>24hrs) due to surgical blood loss or risk of bleeding: no

## 2012-05-07 NOTE — Interval H&P Note (Signed)
History and Physical Interval Note:  05/07/2012 9:25 AM  Latasha Evans  has presented today for surgery, with the diagnosis of Right Knee Lateral Osteoarthritis  The various methods of treatment have been discussed with the patient and family. After consideration of risks, benefits and other options for treatment, the patient has consented to  Procedure(s): RIGHT LATERAL UNICOMPARTMENTAL KNEE (Right) as a surgical intervention .  The patient's history has been reviewed, patient examined, no change in status, stable for surgery.  I have reviewed the patient's chart and labs.  Questions were answered to the patient's satisfaction.     [name]

## 2012-05-07 NOTE — Anesthesia Preprocedure Evaluation (Addendum)
Anesthesia Evaluation  Patient identified by MRN, date of birth, ID band Patient awake    Reviewed: Allergy & Precautions, H&P , NPO status , Patient's Chart, lab work & pertinent test results  Airway Mallampati: I TM Distance: >3 FB Neck ROM: Full    Dental  (+) Dental Advisory Given and Teeth Intact   Pulmonary neg pulmonary ROS,  breath sounds clear to auscultation        Cardiovascular Rhythm:Regular Rate:Normal  RBBB   Neuro/Psych PSYCHIATRIC DISORDERS Anxiety negative neurological ROS     GI/Hepatic negative GI ROS, Neg liver ROS,   Endo/Other  negative endocrine ROS  Renal/GU negative Renal ROS     Musculoskeletal  (+) Arthritis -, Osteoarthritis,    Abdominal   Peds  Hematology negative hematology ROS (+)   Anesthesia Other Findings   Reproductive/Obstetrics negative OB ROS                          Anesthesia Physical Anesthesia Plan  ASA: II  Anesthesia Plan: Spinal   Post-op Pain Management:    Induction: Intravenous  Airway Management Planned: Simple Face Mask and Nasal Cannula  Additional Equipment:   Intra-op Plan:   Post-operative Plan:   Informed Consent: I have reviewed the patients History and Physical, chart, labs and discussed the procedure including the risks, benefits and alternatives for the proposed anesthesia with the patient or authorized representative who has indicated his/her understanding and acceptance.   Dental advisory given  Plan Discussed with: CRNA  Anesthesia Plan Comments:         Anesthesia Quick Evaluation

## 2012-05-07 NOTE — Anesthesia Procedure Notes (Addendum)
Spinal  Patient location during procedure: OR Start time: 05/07/2012 10:05 AM End time: 05/07/2012 10:10 AM Staffing CRNA/Resident: Paris Lore Performed by: resident/CRNA  Preanesthetic Checklist Completed: patient identified, site marked, surgical consent, pre-op evaluation, timeout performed, IV checked, risks and benefits discussed and monitors and equipment checked Spinal Block Patient position: sitting Prep: ChloraPrep and site prepped and draped Patient monitoring: heart rate, continuous pulse ox and blood pressure Approach: midline Location: L3-4 Injection technique: single-shot Needle Needle type: Spinocan and Sprotte  Needle gauge: 24 G Needle length: 9 cm Needle insertion depth: 8 cm Assessment Sensory level: T6 Additional Notes Expiration date of kit checked and confirmed. Patient tolerated procedure well, without complications.  X1 attempt with noted clear CSF return and easy aspiration and administration of medication, noted T6 level on exam.

## 2012-05-07 NOTE — Transfer of Care (Signed)
Immediate Anesthesia Transfer of Care Note  Patient: Latasha Evans  Procedure(s) Performed: Procedure(s) (LRB): RIGHT LATERAL UNICOMPARTMENTAL KNEE (Right)  Patient Location: PACU  Anesthesia Type: Spinal  Level of Consciousness: sedated, patient cooperative and responds to stimulaton  Airway & Oxygen Therapy: Patient Spontanous Breathing and Patient connected to face mask oxgen  Post-op Assessment: Report given to PACU RN and Post -op Vital signs reviewed and stable  Post vital signs: Reviewed and stable  Complications: No apparent anesthesia complications  Noted L1 level on exam, pt denied pain with slight movement of lower toes bilat ext.

## 2012-05-07 NOTE — Anesthesia Postprocedure Evaluation (Signed)
Anesthesia Post Note  Patient: Latasha Evans  Procedure(s) Performed: Procedure(s) (LRB): RIGHT LATERAL UNICOMPARTMENTAL KNEE (Right)  Anesthesia type: Spinal  Patient location: PACU  Post pain: Pain level controlled  Post assessment: Post-op Vital signs reviewed  Last Vitals: BP 138/84  Pulse 66  Temp(Src) 36.3 C (Oral)  Resp 12  SpO2 95%  Post vital signs: Reviewed  Level of consciousness: sedated  Complications: No apparent anesthesia complications

## 2012-05-08 DIAGNOSIS — E669 Obesity, unspecified: Secondary | ICD-10-CM | POA: Diagnosis present

## 2012-05-08 LAB — CBC
HCT: 38.8 % (ref 36.0–46.0)
Hemoglobin: 12.1 g/dL (ref 12.0–15.0)
MCV: 81.7 fL (ref 78.0–100.0)
RDW: 15.1 % (ref 11.5–15.5)
WBC: 7.8 10*3/uL (ref 4.0–10.5)

## 2012-05-08 LAB — BASIC METABOLIC PANEL
BUN: 8 mg/dL (ref 6–23)
CO2: 27 mEq/L (ref 19–32)
Chloride: 101 mEq/L (ref 96–112)
Creatinine, Ser: 0.69 mg/dL (ref 0.50–1.10)
GFR calc Af Amer: >90 mL/min (ref >90)
Glucose, Bld: 119 mg/dL — ABNORMAL HIGH (ref 70–99)

## 2012-05-08 MED ORDER — POLYETHYLENE GLYCOL 3350 17 G PO PACK: 17.0000 g | PACK | Freq: Two times a day (BID) | ORAL | Status: DC

## 2012-05-08 MED ORDER — ASPIRIN EC 325 MG PO TBEC: 325.0000 mg | DELAYED_RELEASE_TABLET | Freq: Two times a day (BID) | ORAL | Status: DC

## 2012-05-08 MED ORDER — CELECOXIB 200 MG PO CAPS: 200.0000 mg | ORAL_CAPSULE | Freq: Two times a day (BID) | ORAL | Status: DC

## 2012-05-08 MED ORDER — HYDROCODONE-ACETAMINOPHEN 7.5-325 MG PO TABS: 1.0000 | ORAL_TABLET | ORAL | Status: DC | PRN

## 2012-05-08 MED ORDER — KETOROLAC TROMETHAMINE 15 MG/ML IJ SOLN
7.5000 mg | Freq: Four times a day (QID) | INTRAMUSCULAR | Status: DC
Start: 2012-05-08 — End: 2012-05-09
  Administered 2012-05-08: 7.5 mg via INTRAVENOUS
  Filled 2012-05-08 (×5): qty 1

## 2012-05-08 MED ORDER — OXYCODONE HCL 5 MG PO TABS
5.0000 mg | ORAL_TABLET | ORAL | Status: DC
  Administered 2012-05-08: 10 mg via ORAL
  Administered 2012-05-09: 5 mg via ORAL
  Administered 2012-05-09 (×3): 10 mg via ORAL
  Filled 2012-05-08 (×5): qty 2

## 2012-05-08 MED ORDER — DSS 100 MG PO CAPS: 100.0000 mg | ORAL_CAPSULE | Freq: Two times a day (BID) | ORAL | Status: DC

## 2012-05-08 MED ORDER — FERROUS SULFATE 325 (65 FE) MG PO TABS: 325.0000 mg | ORAL_TABLET | Freq: Three times a day (TID) | ORAL | Status: DC

## 2012-05-08 MED ORDER — TIZANIDINE HCL 4 MG PO CAPS: 4.0000 mg | ORAL_CAPSULE | Freq: Three times a day (TID) | ORAL | Status: DC

## 2012-05-08 NOTE — Progress Notes (Signed)
   Subjective: 1 Day Post-Op Procedure(s) (LRB): RIGHT LATERAL UNICOMPARTMENTAL KNEE (Right)   Patient reports pain as mild, pain well controlled. No events throughout the night. Ready to be discharged home if does well with PT and pain continues to be controlled.  Objective:   VITALS:   Filed Vitals:   05/08/12 1032  BP: 111/53  Pulse: 65  Temp: 98.4 F (36.9 C)  Resp: 16    Neurovascular intact Dorsiflexion/Plantar flexion intact Incision: dressing C/D/I No cellulitis present Compartment soft  LABS  Recent Labs  05/08/12 0520  HGB 12.1  HCT 38.8  WBC 7.8  PLT 181     Recent Labs  05/08/12 0520  NA 136  K 4.7  BUN 8  CREATININE 0.69  GLUCOSE 119*     Assessment/Plan: 1 Day Post-Op Procedure(s) (LRB): RIGHT LATERAL UNICOMPARTMENTAL KNEE (Right) HV drain d/c'ed Foley cath d/c'ed Advance diet Up with therapy D/C IV fluids Discharge home with home health Follow up in 2 weeks at [facility]. Follow up with OLIN,Junior Huezo D in 2 weeks.  Contact information:  [facility] [address], [address] [place] [address] [phone]    Obese (BMI 30-39.9)  Estimated body mass index is 38.17 kg/(m^2) as calculated from the following:   Height as of this encounter: 5\' 8"  (1.727 m).   Weight as of this encounter: 113.853 kg (251 lb). Patient also counseled that weight may inhibit the healing process Patient counseled that losing weight will help with future health issues     Anastasio Auerbach. Melonie Germani   PAC  05/08/2012, 10:36 AM

## 2012-05-08 NOTE — Progress Notes (Signed)
Received order for rw and commode.  Both delivered to hospital room.

## 2012-05-08 NOTE — Progress Notes (Signed)
Physical Therapy Treatment Patient Details Name: Latasha Evans MRN: [id number] DOB: Jun 05, 1960 Today's Date: 05/08/2012 Time: 4098-1191 PT Time Calculation (min): 33 min  PT Assessment / Plan / Recommendation Comments on Treatment Session  Pt making better progress than am, however feel that she will benefit from at least one more therapy session tomorrow prior to D/C.     Follow Up Recommendations  Home health PT;Supervision for mobility/OOB     Does the patient have the potential to tolerate intense rehabilitation     Barriers to Discharge        Equipment Recommendations  Rolling walker with 5" wheels    Recommendations for Other Services    Frequency 7X/week   Plan Discharge plan remains appropriate    Precautions / Restrictions Precautions Precautions: Knee Required Braces or Orthoses: Knee Immobilizer - Right Knee Immobilizer - Right:  (applied KI for stability and pain control ) Restrictions Weight Bearing Restrictions: No Other Position/Activity Restrictions: WBAT   Pertinent Vitals/Pain 8/10 pain, premedicated, icepack applied    Mobility  Bed Mobility Bed Mobility: Supine to Sit;Sit to Supine Supine to Sit: 4: Min assist;HOB elevated Sit to Supine: 4: Min assist;HOB flat Details for Bed Mobility Assistance: Assist for RLE into and out of bed with min cues for hand placement  Transfers Transfers: Sit to Stand;Stand to Sit Sit to Stand: 4: Min guard;4: Min assist;With upper extremity assist;From elevated surface;From bed Stand to Sit: 4: Min guard;With upper extremity assist;To elevated surface;To bed Details for Transfer Assistance: Cues for hand placement and LE management when sitting/standing.  Ambulation/Gait Ambulation/Gait Assistance: 4: Min assist Ambulation Distance (Feet): 85 Feet Assistive device: Rolling walker Ambulation/Gait Assistance Details: Cues for sequencing/technique with RW and to increase UE WB to decrease antalgic gait pattern.    Gait Pattern: Step-to pattern;Decreased stride length;Antalgic;Trunk flexed Gait velocity: decreased    Exercises Total Joint Exercises Ankle Circles/Pumps: AROM;Both;20 reps Quad Sets: AROM;Right;10 reps Heel Slides: AAROM;Right;10 reps Hip ABduction/ADduction: AAROM;Right;10 reps Straight Leg Raises: AAROM;Right;10 reps Goniometric ROM: approx 55 deg of knee flex   PT Diagnosis:    PT Problem List:   PT Treatment Interventions:     PT Goals Acute Rehab PT Goals PT Goal Formulation: With patient Time For Goal Achievement: 05/08/12 Potential to Achieve Goals: Good Pt will go Supine/Side to Sit: with supervision PT Goal: Supine/Side to Sit - Progress: Progressing toward goal Pt will go Sit to Supine/Side: with supervision PT Goal: Sit to Supine/Side - Progress: Progressing toward goal Pt will go Sit to Stand: with supervision PT Goal: Sit to Stand - Progress: Progressing toward goal Pt will go Stand to Sit: with supervision PT Goal: Stand to Sit - Progress: Progressing toward goal Pt will Ambulate: 51 - 150 feet;with supervision;with least restrictive assistive device PT Goal: Ambulate - Progress: Progressing toward goal  Visit Information  Last PT Received On: 05/08/12 Assistance Needed: +1    Subjective Data  Subjective: It just throbs.  Patient Stated Goal: to return home   Cognition  Cognition Arousal/Alertness: Awake/alert Behavior During Therapy: WFL for tasks assessed/performed Overall Cognitive Status: Within Functional Limits for tasks assessed    Balance     End of Session PT - End of Session Equipment Utilized During Treatment: Right knee immobilizer Activity Tolerance: Patient limited by pain Patient left: in bed;with call bell/phone within reach Nurse Communication: Mobility status   GP     Vista Deck 05/08/2012, 3:57 PM

## 2012-05-08 NOTE — Evaluation (Signed)
Physical Therapy Evaluation Patient Details Name: Latasha Evans MRN: [id number] DOB: 07/25/1960 Today's Date: 05/08/2012 Time: 9563-8756 PT Time Calculation (min): 25 min  PT Assessment / Plan / Recommendation Clinical Impression  Pt presents s/p R UKR POD 1 with decreased strength, ROM and mobility.  Tolerated OOB and some ambulation, however became dizzy/nauseated and had to return to recliner.  Pt will benefit from skilled PT in acute venue to address deficits.  PT recommends HHPT for follow up at D/C to maximize pts safety and function.     PT Assessment  Patient needs continued PT services    Follow Up Recommendations  Home health PT;Supervision for mobility/OOB    Does the patient have the potential to tolerate intense rehabilitation      Barriers to Discharge None      Equipment Recommendations  Rolling walker with 5" wheels    Recommendations for Other Services OT consult   Frequency 7X/week    Precautions / Restrictions Precautions Precautions: Knee Restrictions Weight Bearing Restrictions: No Other Position/Activity Restrictions: WBAT   Pertinent Vitals/Pain 8/10 with mobility.  Premedicated and ice packs applied      Mobility  Bed Mobility Bed Mobility: Supine to Sit Supine to Sit: 4: Min assist;HOB elevated Details for Bed Mobility Assistance: Assist for RLE out of bed with min cues for hand placement on bed to better simulate home environment.  Transfers Transfers: Sit to Stand;Stand to Sit Sit to Stand: 4: Min assist;3: Mod assist;From elevated surface;With upper extremity assist;From bed Stand to Sit: 4: Min assist;With upper extremity assist;With armrests;To chair/3-in-1 Details for Transfer Assistance: Assist to rise, stabalize and ensure controlled descent wtih cues for hand placement and LE management.  Ambulation/Gait Ambulation/Gait Assistance: 4: Min assist Ambulation Distance (Feet): 10 Feet Assistive device: Rolling  walker Ambulation/Gait Assistance Details: cues for sequencing/technique with RW and to increase UE WB to decrease antalgic gait pattern.  Pt only able to ambulate approx 10' due to dizziness/nausea.  BP 103/70.   Gait Pattern: Step-to pattern;Decreased stride length;Antalgic;Trunk flexed Gait velocity: decreased Stairs: No Wheelchair Mobility Wheelchair Mobility: No    Exercises     PT Diagnosis: Difficulty walking;Generalized weakness;Acute pain  PT Problem List: Decreased strength;Decreased range of motion;Decreased activity tolerance;Decreased balance;Decreased mobility;Decreased knowledge of use of DME;Pain PT Treatment Interventions: DME instruction;Gait training;Functional mobility training;Therapeutic activities;Therapeutic exercise;Balance training;Patient/family education   PT Goals Acute Rehab PT Goals PT Goal Formulation: With patient Time For Goal Achievement: 05/08/12 Potential to Achieve Goals: Good Pt will go Supine/Side to Sit: with supervision PT Goal: Supine/Side to Sit - Progress: Goal set today Pt will go Sit to Supine/Side: with supervision PT Goal: Sit to Supine/Side - Progress: Goal set today Pt will go Sit to Stand: with supervision PT Goal: Sit to Stand - Progress: Goal set today Pt will go Stand to Sit: with supervision PT Goal: Stand to Sit - Progress: Goal set today Pt will Ambulate: 51 - 150 feet;with supervision;with least restrictive assistive device PT Goal: Ambulate - Progress: Goal set today  Visit Information  Last PT Received On: 05/08/12 Assistance Needed: +1    Subjective Data  Subjective: My knee really hurts when I move, but I got pain medicine earlier.  Patient Stated Goal: to return home   Prior Functioning  Home Living Lives With: Spouse;Family Available Help at Discharge: Family;Available 24 hours/day Type of Home: House Home Access: Level entry Home Layout: Two level;Able to live on main level with bedroom/bathroom Bathroom  Shower/Tub: Engineer, manufacturing systems:  Standard Home Adaptive Equipment: Straight cane Prior Function Level of Independence: Independent with assistive device(s) Able to Take Stairs?: Yes Driving: Yes Communication Communication: No difficulties    Cognition  Cognition Arousal/Alertness: Awake/alert Behavior During Therapy: WFL for tasks assessed/performed Overall Cognitive Status: Within Functional Limits for tasks assessed    Extremity/Trunk Assessment Right Lower Extremity Assessment RLE ROM/Strength/Tone: Deficits RLE ROM/Strength/Tone Deficits: ankle motions WFL, unable to perform SLR due to pain, however had good quad control when in standing.  RLE Sensation: WFL - Light Touch Left Lower Extremity Assessment LLE ROM/Strength/Tone: WFL for tasks assessed LLE Sensation: WFL - Light Touch Trunk Assessment Trunk Assessment: Normal   Balance    End of Session PT - End of Session Equipment Utilized During Treatment: Gait belt Activity Tolerance: Patient limited by fatigue;Other (comment) (dizziness/nausea. ) Patient left: in chair;with call bell/phone within reach Nurse Communication: Mobility status (nausea)  GP     Vista Deck 05/08/2012, 9:10 AM

## 2012-05-08 NOTE — Evaluation (Signed)
Occupational Therapy Evaluation Patient Details Name: Latasha Evans MRN: [id number] DOB: 03/21/1960 Today's Date: 05/08/2012 Time: 5784-6962 OT Time Calculation (min): 21 min  OT Assessment / Plan / Recommendation Clinical Impression  Pt presents to OT s/p knee surgery. Upon OT eval pt in pain and feeling sick. Pt will benefit from skilled OT to increase I with ADL activity and return to PLOF    OT Assessment  Patient needs continued OT Services    Follow Up Recommendations  No OT follow up             Frequency  Min 2X/week    Precautions / Restrictions Precautions Precautions: Knee Restrictions Weight Bearing Restrictions: No Other Position/Activity Restrictions: WBAT       ADL  Lower Body Dressing: Simulated;Moderate assistance Where Assessed - Lower Body Dressing: Unsupported sit to stand Toilet Transfer: Simulated;Minimal assistance Toilet Transfer Method: Sit to stand;Other (comment) (bed to chair) Toileting - Clothing Manipulation and Hygiene: Simulated;Minimal assistance Transfers/Ambulation Related to ADLs: Educated pt on ADL activity s/p knee surgery. Pt in pain and feeling sick. Will follow up with pt later in day or in the  morning.    OT Diagnosis: Generalized weakness;Acute pain  OT Problem List: Decreased strength;Pain OT Treatment Interventions: Self-care/ADL training;Patient/family education;DME and/or AE instruction   OT Goals Acute Rehab OT Goals OT Goal Formulation: With patient Time For Goal Achievement: 05/22/12 Potential to Achieve Goals: Good ADL Goals Pt Will Perform Lower Body Dressing: with supervision;Sit to stand from chair ADL Goal: Lower Body Dressing - Progress: Goal set today Pt Will Transfer to Toilet: with supervision;Comfort height toilet ADL Goal: Toilet Transfer - Progress: Goal set today Pt Will Perform Toileting - Clothing Manipulation: with supervision;Standing ADL Goal: Toileting - Clothing Manipulation - Progress:  Goal set today Pt Will Perform Tub/Shower Transfer: Tub transfer;with supervision;Ambulation ADL Goal: Tub/Shower Transfer - Progress: Goal set today  Visit Information  Last OT Received On: 05/08/12 Assistance Needed: +1    Subjective Data  Subjective: I am in pain and feeling sick   Prior Functioning     Home Living Lives With: Spouse;Family Available Help at Discharge: Family;Available 24 hours/day Type of Home: House Home Access: Level entry Home Layout: Two level;Able to live on main level with bedroom/bathroom Bathroom Shower/Tub: Engineer, manufacturing systems: Standard Home Adaptive Equipment: Straight cane Prior Function Level of Independence: Independent with assistive device(s) Able to Take Stairs?: Yes Driving: Yes Communication Communication: No difficulties         Vision/Perception Vision - History Patient Visual Report: No change from baseline   Cognition  Cognition Arousal/Alertness: Awake/alert Behavior During Therapy: WFL for tasks assessed/performed Overall Cognitive Status: Within Functional Limits for tasks assessed    Extremity/Trunk Assessment Right Upper Extremity Assessment RUE ROM/Strength/Tone: [facility] for tasks assessed Left Upper Extremity Assessment LUE ROM/Strength/Tone: WFL for tasks assessed     Mobility Bed Mobility Bed Mobility: Supine to Sit Supine to Sit: 4: Min assist;HOB elevated Details for Bed Mobility Assistance: Assist for RLE out of bed with min cues for hand placement on bed to better simulate home environment.  Transfers Sit to Stand: 4: Min assist;From elevated surface;With upper extremity assist;From bed;From chair/3-in-1 Stand to Sit: 4: Min assist;With upper extremity assist;With armrests;To chair/3-in-1;To bed Details for Transfer Assistance: Assist to rise, stabalize and ensure controlled descent wtih cues for hand placement and LE management.            End of Session OT - End of Session Activity  Tolerance:  Patient limited by fatigue;Patient limited by pain Patient left: in bed;with family/visitor present;with call bell/phone within reach  GO     [facility], [place] 05/08/2012, 1:56 PM

## 2012-05-09 DIAGNOSIS — D500 Iron deficiency anemia secondary to blood loss (chronic): Secondary | ICD-10-CM | POA: Diagnosis not present

## 2012-05-09 DIAGNOSIS — E871 Hypo-osmolality and hyponatremia: Secondary | ICD-10-CM | POA: Diagnosis not present

## 2012-05-09 LAB — BASIC METABOLIC PANEL
BUN: 15 mg/dL (ref 6–23)
Calcium: 8.4 mg/dL (ref 8.4–10.5)
Creatinine, Ser: 0.92 mg/dL (ref 0.50–1.10)
GFR calc Af Amer: 83 mL/min — ABNORMAL LOW (ref >90)
GFR calc non Af Amer: 71 mL/min — ABNORMAL LOW (ref >90)

## 2012-05-09 LAB — CBC
HCT: 33.1 % — ABNORMAL LOW (ref 36.0–46.0)
MCHC: 32.9 g/dL (ref 30.0–36.0)
MCV: 81.7 fL (ref 78.0–100.0)
RDW: 15.3 % (ref 11.5–15.5)

## 2012-05-09 MED ORDER — AMPHETAMINE-DEXTROAMPHET ER 20 MG PO CP24: 50.0000 mg | ORAL_CAPSULE | ORAL | Status: DC

## 2012-05-09 MED ORDER — TIZANIDINE HCL 4 MG PO CAPS: 4.0000 mg | ORAL_CAPSULE | Freq: Three times a day (TID) | ORAL | Status: AC

## 2012-05-09 MED ORDER — TIZANIDINE HCL 4 MG PO CAPS: 4.0000 mg | ORAL_CAPSULE | Freq: Three times a day (TID) | ORAL | Status: DC

## 2012-05-09 MED ORDER — KETOROLAC TROMETHAMINE 15 MG/ML IJ SOLN
7.5000 mg | Freq: Four times a day (QID) | INTRAMUSCULAR | Status: DC
  Administered 2012-05-09 (×2): 7.5 mg via INTRAVENOUS
  Filled 2012-05-09 (×2): qty 1

## 2012-05-09 MED ORDER — OXYCODONE HCL 5 MG PO TABS: 5.0000 mg | ORAL_TABLET | ORAL | Status: DC | PRN

## 2012-05-09 NOTE — Progress Notes (Signed)
Physical Therapy Treatment Patient Details Name: Latasha Evans MRN: [id number] DOB: 11-24-1960 Today's Date: 05/09/2012 Time: 8657-8469 PT Time Calculation (min): 52 min  PT Assessment / Plan / Recommendation Comments on Treatment Session  Pt continues to make slow progress, however feel one more session before D/C will be beneficial.     Follow Up Recommendations  Home health PT;Supervision for mobility/OOB     Does the patient have the potential to tolerate intense rehabilitation     Barriers to Discharge        Equipment Recommendations  Rolling walker with 5" wheels    Recommendations for Other Services    Frequency 7X/week   Plan Discharge plan remains appropriate    Precautions / Restrictions Precautions Precautions: Knee Required Braces or Orthoses: Knee Immobilizer - Right Knee Immobilizer - Right:  (applied KI for safety and to maintain knee ext) Restrictions Weight Bearing Restrictions: No Other Position/Activity Restrictions: WBAT   Pertinent Vitals/Pain 7/10 pain, ice packs applied    Mobility  Bed Mobility Bed Mobility: Supine to Sit Supine to Sit: 4: Min assist Details for Bed Mobility Assistance: Assist for RLE out of bed with cues for hand placement on bed to better simulate home environment.  Transfers Transfers: Sit to Stand;Stand to Sit Sit to Stand: 4: Min guard;From elevated surface;With upper extremity assist;From chair/3-in-1 Stand to Sit: 4: Min guard;With upper extremity assist;With armrests;To chair/3-in-1 Details for Transfer Assistance: Performed x 2 in order to use 3in1 in restroom with cues for hand placement, safety and LE management.  Pt tends to want to sit without being all the way back to chair.  Ambulation/Gait Ambulation/Gait Assistance: 4: Min assist;4: Min Government social research officer (Feet): 85 Feet Assistive device: Rolling walker Ambulation/Gait Assistance Details: Continue to provide cues for sequencing/technique with RW  and to maintain upright posture.   Gait Pattern: Step-to pattern;Decreased stride length;Antalgic;Trunk flexed Gait velocity: decreased    Exercises Total Joint Exercises Ankle Circles/Pumps: AROM;Both;20 reps Quad Sets: AROM;Right;10 reps (with towel roll under) Heel Slides: AAROM;Right;10 reps Hip ABduction/ADduction: AAROM;Right;10 reps Straight Leg Raises: AAROM;Right;10 reps Goniometric ROM: 60 deg knee flex   PT Diagnosis:    PT Problem List:   PT Treatment Interventions:     PT Goals Acute Rehab PT Goals PT Goal Formulation: With patient Time For Goal Achievement: 05/08/12 Potential to Achieve Goals: Good Pt will go Supine/Side to Sit: with supervision PT Goal: Supine/Side to Sit - Progress: Progressing toward goal Pt will go Sit to Stand: with supervision PT Goal: Sit to Stand - Progress: Progressing toward goal Pt will go Stand to Sit: with supervision PT Goal: Stand to Sit - Progress: Progressing toward goal Pt will Ambulate: 51 - 150 feet;with supervision;with least restrictive assistive device PT Goal: Ambulate - Progress: Progressing toward goal  Visit Information  Last PT Received On: 05/09/12 Assistance Needed: +1    Subjective Data  Subjective: This pain medicine just isn't working.  Patient Stated Goal: to return home   Cognition  Cognition Arousal/Alertness: Awake/alert Behavior During Therapy: WFL for tasks assessed/performed Overall Cognitive Status: Within Functional Limits for tasks assessed    Balance     End of Session PT - End of Session Equipment Utilized During Treatment: Right knee immobilizer Activity Tolerance: Patient limited by pain Patient left: in chair;with call bell/phone within reach Nurse Communication: Mobility status   GP     Vista Deck 05/09/2012, 9:02 AM

## 2012-05-09 NOTE — Progress Notes (Signed)
Pt having complaints of pain not being controlled on scheduled Vicodin. Dr. Charlann Boxer notified and orders received.

## 2012-05-09 NOTE — Progress Notes (Signed)
   Subjective: 2 Days Post-Op Procedure(s) (LRB): RIGHT LATERAL UNICOMPARTMENTAL KNEE (Right)   Patient reports pain as moderate, states that the incision is causing her a lot of pain otherwise the knee is ok.  No events throughout the night. Ready to be discharged home.  Objective:   VITALS:   Filed Vitals:   05/09/12 0539  BP: 98/66  Pulse: 112  Temp: 99.6 F (37.6 C)  Resp: 18    Neurovascular intact Dorsiflexion/Plantar flexion intact Incision: dressing C/D/I No cellulitis present Compartment soft  LABS  Recent Labs  05/08/12 0520 05/09/12 0515  HGB 12.1 10.9*  HCT 38.8 33.1*  WBC 7.8 6.9  PLT 181 182     Recent Labs  05/08/12 0520 05/09/12 0515  NA 136 131*  K 4.7 4.6  BUN 8 15  CREATININE 0.69 0.92  GLUCOSE 119* 109*     Assessment/Plan: 2 Days Post-Op Procedure(s) (LRB): RIGHT LATERAL UNICOMPARTMENTAL KNEE (Right) Up with therapy Discharge home with home health Follow up in 2 weeks at [facility]. Follow up with OLIN,Che[place]ha Behlke D in 2 weeks.  Contact information:  [facility] [address], [address] [place] [address] [phone]    Expected ABLA  Treated with iron and will observe  Obese (BMI 30-39.9) Estimated body mass index is 38.17 kg/(m^2) as calculated from the following:   Height as of this encounter: 5\' 8"  (1.727 m).   Weight as of this encounter: 113.853 kg (251 lb). Patient also counseled that weight may inhibit the healing process Patient counseled that losing weight will help with future health issues  Hyponatremia Treated with IV fluids and will observe     Anastasio Auerbach. Rohith Fauth   PAC  05/09/2012, 10:01 AM

## 2012-05-09 NOTE — Care Management Note (Signed)
    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 PLANS ARE FOR PATIENT TO RETURN TO HER HOME IN [place] WHERE FRIEND WILL BE CAREGIVER. STATES SHE WILL NEED RW AND 3N1   Anticipated DC Date:  05/08/2012   Anticipated DC Plan:  HOME W HOME HEALTH SERVICES      DC Planning Services  CM consult      [facility] Choice  HOME HEALTH  DURABLE MEDICAL EQUIPMENT   Choice offered to / List presented to:  C-1 Patient   DME arranged  3-N-1  Levan Hurst      DME agency  [facility]     HH arranged  HH-2 PT      [facility] agency  [facility]   Status of service:  Completed, signed off Medicare Important Message given?  NO (If response is "NO", the following Medicare IM given date fields will be blank) Date Medicare IM given:   Date Additional Medicare IM given:    Discharge Disposition:  HOME W HOME HEALTH SERVICES  Per UR Regulation:  Reviewed for med. necessity/level of care/duration of stay  If discussed at Long Length of Stay Meetings, dates discussed:    Comments:  05/09/2012 Colleen Can BSN RN CCM [phone] [facility] has been authorized to provide HHservices for patient via 1 call managemnt. Pt was given [facility] contact information. [facility] will start services tomorrow 05/10/2012. Pt discharged today.   05/08/2012 Kosei Rhodes BSN RN CCM [phone] [facility] RE[facility] ADVISES THAT THIS IS A REFERRAL FROM [facility] OFF[facility] AND THAT THEY HAVE RECEIVED APPROVAL FROM WORKER'S COMP TO PROVIDE HH SERVICES. WORKER'S COMP CASE MANAGER -CINDY CALLED REGARDING DME NEEDS. SHE ADVISED THIS CM TO  CALL [facility] FOR DME [phone]. CM SPOKE WITH RACHEL AT [facility]TAKE WHO ADVISED ME TO FAX ORDERS, FACE SHEET TO [phone]. PATIENT ID NUMBER GIVN WAS [id number]; CONFIRMATION OF FAX RECEIVED. RECEIVED CALL FROM 1 CALL MANAGEMNT THAT [facility] HAD BEEN AUTHORIZED TO PROVIDE DME.

## 2012-05-09 NOTE — Progress Notes (Signed)
Occupational Therapy Treatment and discharge note Patient Details Name: Latasha Evans MRN: [id number] DOB: 1961-01-14 Today's Date: 05/09/2012 Time: 8469-6295 OT Time Calculation (min): 15 min  OT Assessment / Plan / Recommendation Comments on Treatment Session Pt is discharging today.  All education completed.  Will have assist of family as needed at home.    Follow Up Recommendations  No OT follow up    Barriers to Discharge       Equipment Recommendations       Recommendations for Other Services    Frequency Min 2X/week   Plan Discharge plan remains appropriate    Precautions / Restrictions Precautions Precautions: Knee Required Braces or Orthoses: Knee Immobilizer - Right Restrictions Weight Bearing Restrictions: No Other Position/Activity Restrictions: WBAT   Pertinent Vitals/Pain R knee, did not rate, premedicated    ADL  Toilet Transfer: Min guard Toilet Transfer Method: Sit to Barista: Raised toilet seat with arms (or 3-in-1 over toilet) Toileting - Clothing Manipulation and Hygiene: Supervision/safety Where Assessed - Toileting Clothing Manipulation and Hygiene: Sit to stand from 3-in-1 or toilet Equipment Used: Long-handled shoe horn;Long-handled sponge;Reacher;Rolling walker;Knee Immobilizer;Sock aid Transfers/Ambulation Related to ADLs: min guard assist with RW ADL Comments: Educated pt in availability of AE for LB ADL.  Pt will use 3 in1 as shower seat in a garden tub, sitting on edge of tub and swinging legs over edge rather than stepping over edge.    OT Diagnosis:    OT Problem List:   OT Treatment Interventions:     OT Goals ADL Goals Pt Will Perform Lower Body Dressing: with supervision;Sit to stand from chair ADL Goal: Lower Body Dressing - Progress: Progressing toward goals Pt Will Transfer to Toilet: with supervision;Comfort height toilet ADL Goal: Toilet Transfer - Progress: Progressing toward goals Pt Will  Perform Toileting - Clothing Manipulation: with supervision;Standing ADL Goal: Toileting - Clothing Manipulation - Progress: Progressing toward goals Pt Will Perform Tub/Shower Transfer: Tub transfer;with supervision;Ambulation ADL Goal: Tub/Shower Transfer - Progress: Progressing toward goals  Visit Information  Last OT Received On: 05/09/12 Assistance Needed: +1    Subjective Data      Prior Functioning       Cognition  Cognition Arousal/Alertness: Awake/alert Behavior During Therapy: WFL for tasks assessed/performed Overall Cognitive Status: Within Functional Limits for tasks assessed    Mobility  Bed Mobility Bed Mobility: Not assessed Transfers Transfers: Sit to Stand;Stand to Sit Sit to Stand: 4: Min guard;With upper extremity assist;From chair/3-in-1 Stand to Sit: 4: Min guard;With upper extremity assist;With armrests;To chair/3-in-1    Exercises      Balance     End of Session OT - End of Session Activity Tolerance: Patient tolerated treatment well Patient left: in chair;with call bell/phone within reach  GO     Evern Bio 05/09/2012, 2:47 PM [phone]

## 2012-05-13 NOTE — Discharge Summary (Signed)
Physician Discharge Summary  Patient ID: AKEYLA MOLDEN MRN: [id number] DOB/AGE: 51/16/1962 51 y.o.  Admit date: 05/07/2012 Discharge date: 05/09/2012   Procedures:  Procedure(s) (LRB): RIGHT LATERAL UNICOMPARTMENTAL KNEE (Right)  Attending Physician:  Dr. Durene Romans   Admission Diagnoses:   Right knee lateral compartment pain  Discharge Diagnoses:  Principal Problem:   S/P right UKR Active Problems:   Obesity (BMI 30-39.9)   Expected blood loss anemia   Hyponatremia  Diagnosis  . ADD (attention deficit disorder)  . Anxiety  . Dysrhythmia  -  RBBB  . Anemia  . Arthritis    HPI:   Latasha Evans, 52 y.o. female, has a history of pain and functional disability in the right knee due to arthritis and has failed non-surgical conservative treatments for greater than 12 weeks to includeNSAID's and/or analgesics, corticosteriod injections, viscosupplementation injections, use of assistive devices and activity modification. Onset of symptoms was gradual, starting 2 years ago with gradually worsening course since that time. The patient noted prior procedures on the knee to include arthroscopy on the right knee(s). Patient currently rates pain in the right knee(s) at 9 out of 10 with activity. Patient has night pain, worsening of pain with activity and weight bearing, pain that interferes with activities of daily living, pain with passive range of motion, crepitus and joint swelling. Patient has evidence of periarticular osteophytes and joint space narrowing in the lateral compartment by imaging studies. There is no active infection. Risks, benefits and expectations were discussed with the patient. Patient understand the risks, benefits and expectations and wishes to proceed with surgery.   PCP: Eartha Inch, MD   Discharged Condition: good  Hospital Course:  Patient underwent the above stated procedure on 05/07/2012. Patient tolerated the procedure well and brought to  the recovery room in good condition and subsequently to the floor.  POD #1 BP: 111/53 ; Pulse: 65 ; Temp: 98.4 F (36.9 C) ; Resp: 16  Pt's foley was removed, as well as the hemovac drain removed. IV was changed to a saline lock. Patient reports pain as mild, pain well controlled. No events throughout the night. Ready to be discharged home if does well with PT and pain continues to be controlled. Neurovascular intact, dorsiflexion/plantar flexion intact, incision: dressing C/D/I, no cellulitis present and compartment soft.   LABS  Basename  05/08/12    0520  HGB  12.1  HCT  38.8   POD #2  BP: 98/66 ; Pulse: 112 ; Temp: 99.6 F (37.6 C) ; Resp: 18  Patient reports pain as moderate, states that the incision is causing her a lot of pain otherwise the knee is ok. No events throughout the night. Ready to be discharged home. Neurovascular intact, dorsiflexion/plantar flexion intact, incision: dressing C/D/I, no cellulitis present and compartment soft.   LABS  Basename  05/09/12    0515   HGB  10.9  HCT  33.1    Discharge Exam: General appearance: alert, cooperative and no distress Extremities: Homans sign is negative, no sign of DVT, no edema, redness or tenderness in the calves or thighs and no ulcers, gangrene or trophic changes  Disposition:   Home-Health Care Svc with follow up in 2 weeks   Follow-up Information   Follow up with [name], MD. Schedule an appointment as soon as possible for a visit in 2 weeks.   Contact information:   [address] [place] [address] [phone]       Discharge  Orders   Future Orders Complete By Expires     Call MD / Call 911  As directed     Comments:      If you experience chest pain or shortness of breath, CALL 911 and be transported to the hospital emergency room.  If you develope a fever above 101 F, pus (white drainage) or increased drainage or redness at the wound, or calf pain, call your surgeon's office.     Change dressing  As directed     Comments:      Maintain surgical dressing for 10-14 days, then change the dressing daily with sterile 4 x 4 inch gauze dressing and tape. Keep the area dry and clean.    Constipation Prevention  As directed     Comments:      Drink plenty of fluids.  Prune juice may be helpful.  You may use a stool softener, such as Colace (over the counter) 100 mg twice a day.  Use MiraLax (over the counter) for constipation as needed.    Diet - low sodium heart healthy  As directed     Discharge instructions  As directed     Comments:      Maintain surgical dressing for 10-14 days, then replace with gauze and tape. Keep the area dry and clean until follow up. Follow up in 2 weeks at [facility]. Call with any questions or concerns.    Driving restrictions  As directed     Comments:      No driving for 4 weeks    Increase activity slowly as tolerated  As directed     TED hose  As directed     Comments:      Use stockings (TED hose) for 2 weeks on both leg(s).  You may remove them at night for sleeping.    Weight bearing as tolerated  As directed          Medication List    STOP taking these medications       naproxen 500 MG 24 hr tablet  Commonly known as:  NAPRELAN     traMADol-acetaminophen 37.5-325 MG per tablet  Commonly known as:  ULTRACET      TAKE these medications       amphetamine-dextroamphetamine 25 MG 24 hr capsule  Commonly known as:  ADDERALL XR  Take 50 mg by mouth every morning.     aspirin EC 325 MG tablet  Take 1 tablet (325 mg total) by mouth 2 (two) times daily.     b complex vitamins tablet  Take 1 tablet by mouth daily.     CALCIUM CITRATE-VITAMIN D3 PO  Take 4 tablets by mouth 2 (two) times daily.     celecoxib 200 MG capsule  Commonly known as:  CELEBREX  Take 1 capsule (200 mg total) by mouth every 12 (twelve) hours.     DSS 100 MG Caps  Take 100 mg by mouth 2 (two) times daily.     ferrous sulfate 325 (65  FE) MG tablet  Take 1 tablet (325 mg total) by mouth 3 (three) times daily after meals.     fluticasone 50 MCG/ACT nasal spray  Commonly known as:  FLONASE  Place 2 sprays into the nose daily as needed for allergies.     glucosamine-chondroitin 500-400 MG tablet  Take 3 tablets by mouth every morning.     multivitamin with minerals Tabs  Take 1 tablet by mouth at bedtime.  oxyCODONE 5 MG immediate release tablet  Commonly known as:  Oxy IR/ROXICODONE  Take 1-3 tablets (5-15 mg total) by mouth every 4 (four) hours as needed for pain.     polyethylene glycol packet  Commonly known as:  MIRALAX / GLYCOLAX  Take 17 g by mouth 2 (two) times daily.     sertraline 50 MG tablet  Commonly known as:  ZOLOFT  Take 50 mg by mouth at bedtime.     tiZANidine 4 MG capsule  Commonly known as:  ZANAFLEX  Take 1 capsule (4 mg total) by mouth 3 (three) times daily. Muscle spasms     traZODone 50 MG tablet  Commonly known as:  DESYREL  Take 150 mg by mouth at bedtime.     vitamin A 16109 UNIT capsule  Take 10,000 Units by mouth daily.     vitamin C 500 MG tablet  Commonly known as:  ASCORBIC ACID  Take 500 mg by mouth at bedtime.     Vitamin D (Ergocalciferol) 50000 UNITS Caps  Commonly known as:  DRISDOL  Take 50,000 Units by mouth every Tuesday.     vitamin E 400 UNIT capsule  Take 400 Units by mouth daily.         Signed: Anastasio Auerbach. Jailynne Opperman   PAC  05/13/2012, 6:56 PM

## 2013-05-02 ENCOUNTER — Other Ambulatory Visit (HOSPITAL_COMMUNITY): Payer: Self-pay | Admitting: Family Medicine

## 2013-05-02 DIAGNOSIS — Z1231 Encounter for screening mammogram for malignant neoplasm of breast: Secondary | ICD-10-CM

## 2013-05-09 ENCOUNTER — Ambulatory Visit (HOSPITAL_COMMUNITY)
Admission: RE | Admit: 2013-05-09 | Discharge: 2013-05-09 | Disposition: A | Discharge status: Home / Self Care | Payer: BC Managed Care – PPO | Source: Ambulatory Visit | Attending: Family Medicine | Admitting: Family Medicine

## 2013-05-09 DIAGNOSIS — Z1231 Encounter for screening mammogram for malignant neoplasm of breast: Secondary | ICD-10-CM | POA: Insufficient documentation

## 2013-07-22 ENCOUNTER — Encounter: Payer: Self-pay | Admitting: Internal Medicine

## 2013-09-05 ENCOUNTER — Ambulatory Visit (AMBULATORY_SURGERY_CENTER): Payer: Self-pay | Admitting: *Deleted

## 2013-09-05 VITALS — Ht 70.0 in | Wt 241.2 lb

## 2013-09-05 DIAGNOSIS — Z1211 Encounter for screening for malignant neoplasm of colon: Secondary | ICD-10-CM

## 2013-09-05 MED ORDER — MOVIPREP 100 G PO SOLR: ORAL | Status: DC

## 2013-09-05 NOTE — Progress Notes (Signed)
No egg or soy allergy  No anesthesia or intubation problems per pt  No diet medications taken  Registered in EMMI Pt has had a partial right knee replacement

## 2013-09-17 ENCOUNTER — Telehealth: Payer: Self-pay | Admitting: Internal Medicine

## 2013-09-17 NOTE — Telephone Encounter (Signed)
Yes, charge. 

## 2013-09-18 ENCOUNTER — Encounter: Payer: Self-pay | Admitting: Internal Medicine

## 2013-10-01 ENCOUNTER — Telehealth: Payer: Self-pay | Admitting: Internal Medicine

## 2013-10-01 ENCOUNTER — Encounter: Payer: Self-pay | Admitting: Internal Medicine

## 2013-10-01 NOTE — Telephone Encounter (Signed)
Yes, charge. Also, putting computer the patient cannot reschedule procedures in the [facility].

## 2014-04-08 IMAGING — CR DG CHEST 2V
2 series · 2 of 2 positions shown · non-contrast
Comparison: 03/30/2007

CLINICAL DATA: Preop radiograph

CHEST - 2 VIEW

[w chest pa]
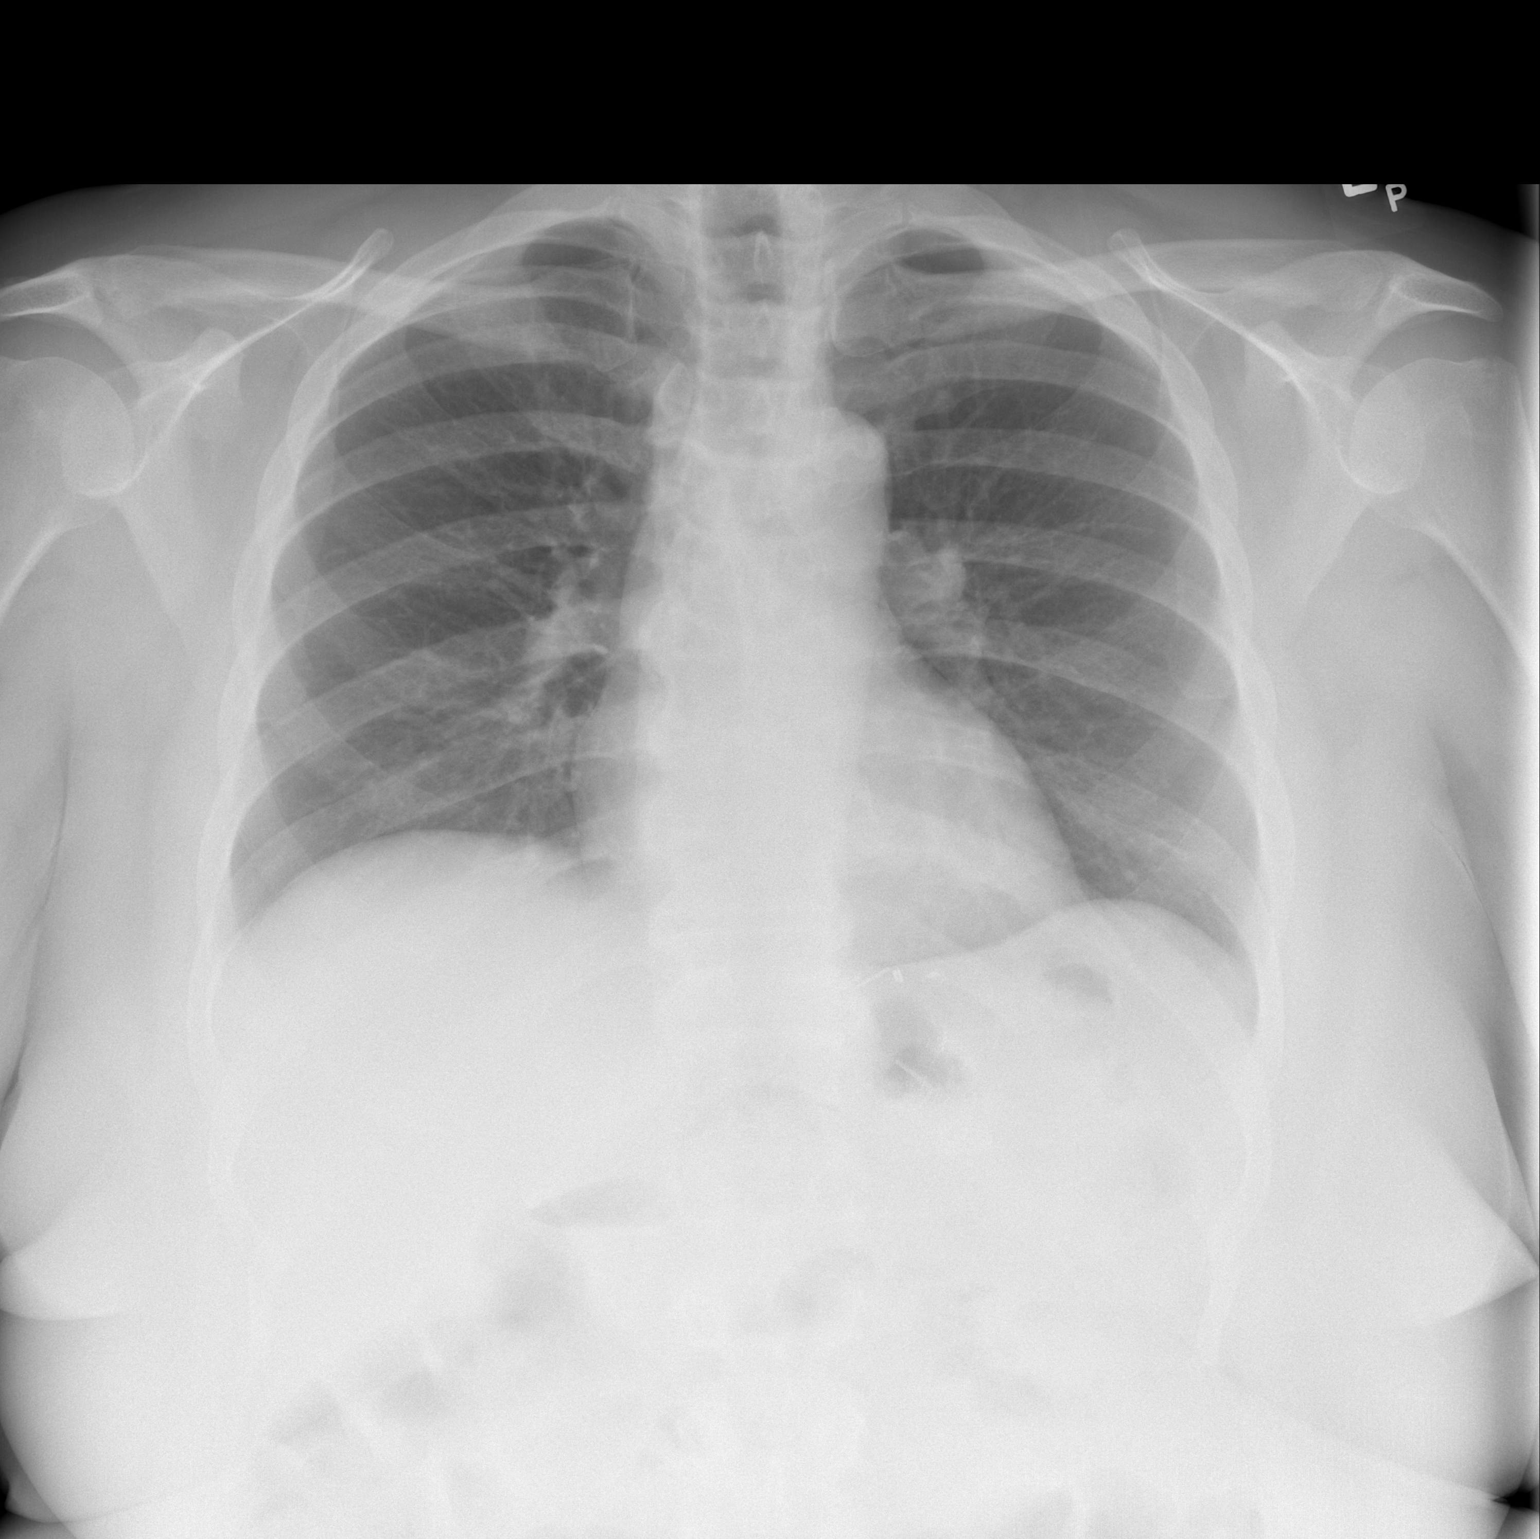

[w chest lat]
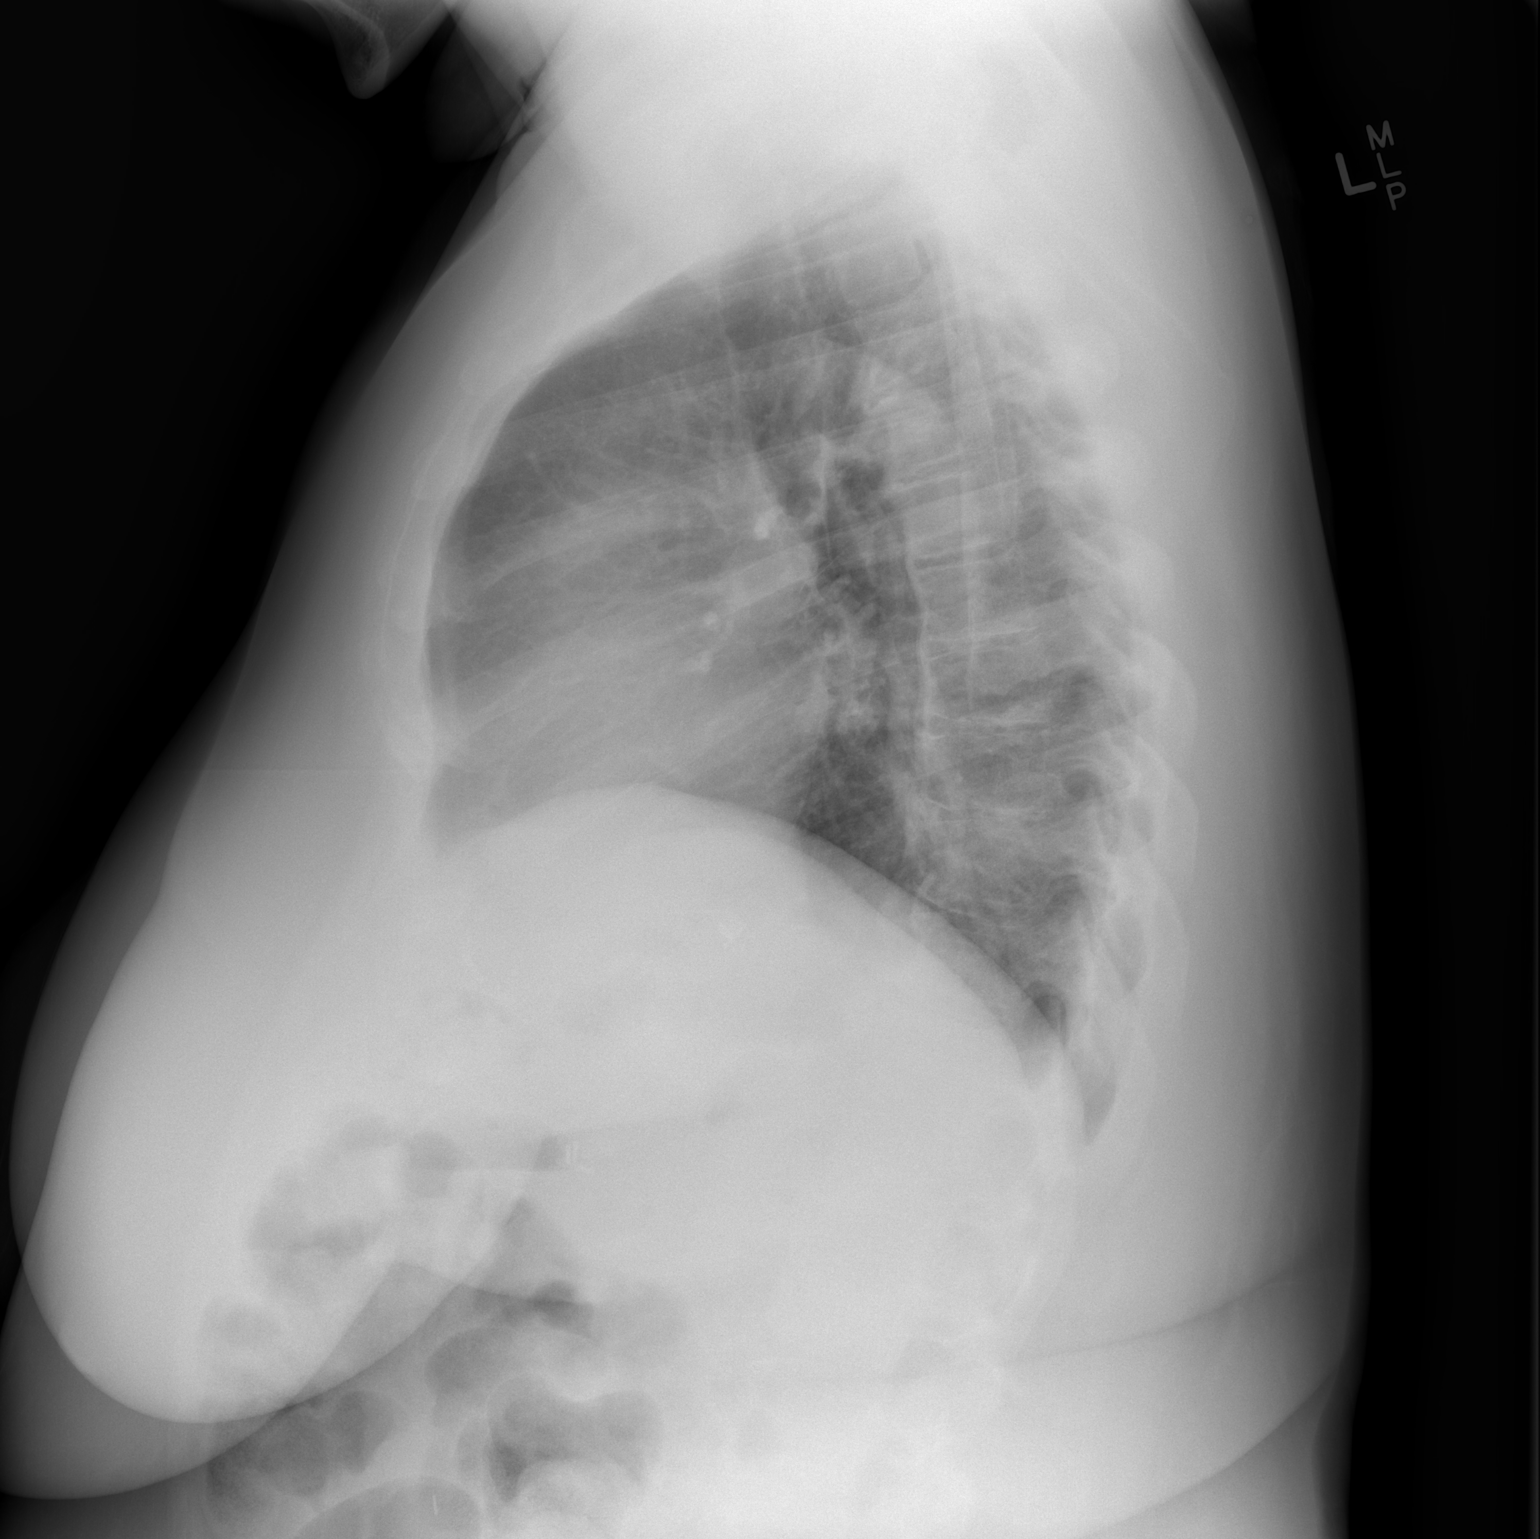

[2 of 2 positions shown; findings below may reference images not displayed]

FINDINGS: The heart size and mediastinal contours are within normal
limits.  Both lungs are clear.  The visualized skeletal structures
are unremarkable.
IMPRESSION: Negative exam.

## 2014-12-02 ENCOUNTER — Other Ambulatory Visit: Payer: Self-pay

## 2014-12-02 DIAGNOSIS — Z1231 Encounter for screening mammogram for malignant neoplasm of breast: Secondary | ICD-10-CM

## 2014-12-25 ENCOUNTER — Ambulatory Visit: Payer: Self-pay

## 2015-04-05 ENCOUNTER — Emergency Department (HOSPITAL_COMMUNITY)
Admission: EM | Admit: 2015-04-05 | Discharge: 2015-04-06 | Disposition: A | Discharge status: Home / Self Care | Payer: BLUE CROSS/BLUE SHIELD | Attending: Emergency Medicine | Admitting: Emergency Medicine

## 2015-04-05 ENCOUNTER — Encounter (HOSPITAL_COMMUNITY): Payer: Self-pay | Admitting: Emergency Medicine

## 2015-04-05 ENCOUNTER — Emergency Department (HOSPITAL_COMMUNITY): Payer: BLUE CROSS/BLUE SHIELD

## 2015-04-05 DIAGNOSIS — Z79899 Other long term (current) drug therapy: Secondary | ICD-10-CM | POA: Diagnosis not present

## 2015-04-05 DIAGNOSIS — M1990 Unspecified osteoarthritis, unspecified site: Secondary | ICD-10-CM | POA: Insufficient documentation

## 2015-04-05 DIAGNOSIS — R42 Dizziness and giddiness: Secondary | ICD-10-CM | POA: Insufficient documentation

## 2015-04-05 DIAGNOSIS — F419 Anxiety disorder, unspecified: Secondary | ICD-10-CM | POA: Diagnosis not present

## 2015-04-05 DIAGNOSIS — R110 Nausea: Secondary | ICD-10-CM | POA: Insufficient documentation

## 2015-04-05 DIAGNOSIS — F909 Attention-deficit hyperactivity disorder, unspecified type: Secondary | ICD-10-CM | POA: Diagnosis not present

## 2015-04-05 DIAGNOSIS — IMO0002 Reserved for concepts with insufficient information to code with codable children: Secondary | ICD-10-CM

## 2015-04-05 DIAGNOSIS — Z862 Personal history of diseases of the blood and blood-forming organs and certain disorders involving the immune mechanism: Secondary | ICD-10-CM | POA: Diagnosis not present

## 2015-04-05 LAB — I-STAT CHEM 8, ED
BUN: 10 mg/dL (ref 6–20)
CALCIUM ION: 1.17 mmol/L (ref 1.12–1.23)
Chloride: 109 mmol/L (ref 101–111)
Creatinine, Ser: 0.8 mg/dL (ref 0.44–1.00)
GLUCOSE: 79 mg/dL (ref 65–99)
HCT: 46 % (ref 36.0–46.0)
HEMOGLOBIN: 15.6 g/dL — AB (ref 12.0–15.0)
Potassium: 4 mmol/L (ref 3.5–5.1)
Sodium: 144 mmol/L (ref 135–145)
TCO2: 23 mmol/L (ref 0–100)

## 2015-04-05 LAB — CBC
HCT: 40.9 % (ref 36.0–46.0)
Hemoglobin: 12.5 g/dL (ref 12.0–15.0)
MCH: 24.9 pg — AB (ref 26.0–34.0)
MCHC: 30.6 g/dL (ref 30.0–36.0)
MCV: 81.5 fL (ref 78.0–100.0)
PLATELETS: 283 10*3/uL (ref 150–400)
RBC: 5.02 MIL/uL (ref 3.87–5.11)
RDW: 17.2 % — ABNORMAL HIGH (ref 11.5–15.5)
WBC: 6.2 10*3/uL (ref 4.0–10.5)

## 2015-04-05 LAB — COMPREHENSIVE METABOLIC PANEL
ALK PHOS: 90 U/L (ref 38–126)
ALT: 16 U/L (ref 14–54)
ANION GAP: 10 (ref 5–15)
AST: 17 U/L (ref 15–41)
Albumin: 3.6 g/dL (ref 3.5–5.0)
BILIRUBIN TOTAL: 0.2 mg/dL — AB (ref 0.3–1.2)
BUN: 9 mg/dL (ref 6–20)
CALCIUM: 9.1 mg/dL (ref 8.9–10.3)
CO2: 23 mmol/L (ref 22–32)
Chloride: 110 mmol/L (ref 101–111)
Creatinine, Ser: 0.85 mg/dL (ref 0.44–1.00)
GFR calc non Af Amer: >60 mL/min (ref >60)
Glucose, Bld: 87 mg/dL (ref 65–99)
Potassium: 4.2 mmol/L (ref 3.5–5.1)
SODIUM: 143 mmol/L (ref 135–145)
TOTAL PROTEIN: 6.9 g/dL (ref 6.5–8.1)

## 2015-04-05 LAB — APTT: aPTT: 35 seconds (ref 24–37)

## 2015-04-05 LAB — I-STAT TROPONIN, ED: Troponin i, poc: 0 ng/mL (ref 0.00–0.08)

## 2015-04-05 LAB — CBG MONITORING, ED: GLUCOSE-CAPILLARY: 69 mg/dL (ref 65–99)

## 2015-04-05 LAB — PROTIME-INR
INR: 0.96 (ref 0.00–1.49)
PROTHROMBIN TIME: 13 s (ref 11.6–15.2)

## 2015-04-05 LAB — DIFFERENTIAL
Basophils Absolute: 0 10*3/uL (ref 0.0–0.1)
Basophils Relative: 0 %
EOS ABS: 0.1 10*3/uL (ref 0.0–0.7)
EOS PCT: 2 %
LYMPHS PCT: 33 %
Lymphs Abs: 2.1 10*3/uL (ref 0.7–4.0)
MONO ABS: 0.4 10*3/uL (ref 0.1–1.0)
Monocytes Relative: 7 %
Neutro Abs: 3.6 10*3/uL (ref 1.7–7.7)
Neutrophils Relative %: 58 %

## 2015-04-05 NOTE — ED Notes (Signed)
C/o dizziness and nausea since waking up this morning.  Also reports multiple episodes of diaphoresis today.  Pt diaphoretic at present.  Reports history of vertigo over a year ago.  Took left over meclizine without relief of symptoms.  States she is uncomfortable and doesn't feel well right now but denies pain.  No neuro deficits noted on triage exam.

## 2015-04-06 MED ORDER — DIAZEPAM 5 MG PO TABS: 5.0000 mg | ORAL_TABLET | Freq: Three times a day (TID) | ORAL | Status: DC | PRN

## 2015-04-06 NOTE — Discharge Instructions (Signed)
Return here as needed.  Follow-up with your primary care doctor as soon as possible.  Return for any worsening in your condition

## 2015-04-06 NOTE — ED Notes (Signed)
Pt verbalized understanding of d/c instructions and has no further questions. Pt stable and NAD.  

## 2015-04-06 NOTE — ED Provider Notes (Signed)
CSN: [id number]     Arrival date & time 04/05/15  2033 History   First MD Initiated Contact with Patient 04/05/15 2143     Chief Complaint  Patient presents with  . Dizziness     (Consider location/radiation/quality/duration/timing/severity/associated sxs/prior Treatment) HPI Patient presents to the emergency department with dizziness and nausea since waking up this morning.  The patient states that she has had a history of vertigo and this feels similar to the previous episode that she has had.  The patient states that she has not had any gait abnormality other than the dizziness.The patient denies chest pain, shortness of breath, headache,blurred vision, neck pain, fever, cough, weakness, numbness, anorexia, edema, abdominal pain, nausea, vomiting, diarrhea, rash, back pain, dysuria, hematemesis, bloody stool, near syncope, or syncope.  Patient states that she took 2 meclizine that she had at home with some relief of her symptoms.  Patient states that her condition is worse with position change Past Medical History  Diagnosis Date  . ADD (attention deficit disorder)   . Anxiety   . Dysrhythmia     RBBB  . Anemia     before menopause  . Arthritis   . Toothache     broken molar - per dentist needs root canal  . Allergy    Past Surgical History  Procedure Laterality Date  . Roux-en-y gastric bypass    . Cholecystectomy    . Knee surgery Right     x3 2012, 2013, 2014  . Foot surgery Right     hammertoe correction with repair fracture second toe  . Partial knee arthroplasty Right 05/07/2012    Procedure: RIGHT LATERAL UNICOMPARTMENTAL KNEE;  Surgeon: [name], MD;  Location: [facility];  Service: Orthopedics;  Laterality: Right;  . Hemorroidectomy     Family History  Problem Relation Age of Onset  . Colon cancer Neg Hx   . Esophageal cancer Neg Hx   . Stomach cancer Neg Hx   . Rectal cancer Neg Hx    Social History  Substance Use Topics  . Smoking status: Never Smoker   .  Smokeless tobacco: Never Used  . Alcohol Use: Yes     Comment: socially- wine   OB History    No data available     Review of Systems  All other systems negative except as documented in the HPI. All pertinent positives and negatives as reviewed in the HPI.  Allergies  Codeine and Adhesive  Home Medications   Prior to Admission medications   Medication Sig Start Date End Date Taking? Authorizing Provider  amphetamine-dextroamphetamine (ADDERALL XR) 25 MG 24 hr capsule Take 50 mg by mouth every morning.   Yes Historical Provider, MD  b complex vitamins tablet Take 1 tablet by mouth daily.   Yes Historical Provider, MD  CALCIUM CITRATE-VITAMIN D3 PO Take 4 tablets by mouth 2 (two) times daily.   Yes Historical Provider, MD  Flaxseed, Linseed, (FLAX SEEDS PO) Take 1 tablet by mouth daily.    Yes Historical Provider, MD  fluticasone (FLONASE) 50 MCG/ACT nasal spray Place 2 sprays into the nose daily as needed for allergies.   Yes Historical Provider, MD  glucosamine-chondroitin 500-400 MG tablet Take 3 tablets by mouth every morning.   Yes Historical Provider, MD  HYDROcodone-acetaminophen (NORCO/VICODIN) 5-325 MG tablet Take 1 tablet by mouth every 6 (six) hours as needed. for pain 01/30/15  Yes Historical Provider, MD  meclizine (ANTIVERT) 25 MG tablet Take 50 mg by mouth 3 (  three) times daily as needed for dizziness.   Yes Historical Provider, MD  meloxicam (MOBIC) 15 MG tablet Take 15 mg by mouth daily as needed for pain.    Yes Historical Provider, MD  Multiple Vitamin (MULTIVITAMIN WITH MINERALS) TABS Take 1 tablet by mouth at bedtime.   Yes Historical Provider, MD  nitroGLYCERIN (NITRODUR - DOSED IN MG/24 HR) 0.1 mg/hr patch Place 0.1 mg onto the skin daily.   Yes Historical Provider, MD  Omega-3 Fatty Acids (FISH OIL PO) Take 1 capsule by mouth daily.    Yes Historical Provider, MD  tiZANidine (ZANAFLEX) 4 MG capsule Take 1 capsule (4 mg total) by mouth 3 (three) times daily. Muscle  spasms Patient taking differently: Take 4 mg by mouth 3 (three) times daily as needed for muscle spasms. Muscle spasms 05/09/12  Yes Lanney GinsMatthew Babish, PA-C  traZODone (DESYREL) 50 MG tablet Take 150 mg by mouth at bedtime.   Yes Historical Provider, MD  vitamin A 2841310000 UNIT capsule Take 10,000 Units by mouth daily.   Yes Historical Provider, MD  vitamin C (ASCORBIC ACID) 500 MG tablet Take 500 mg by mouth at bedtime.   Yes Historical Provider, MD  Vitamin D, Ergocalciferol, (DRISDOL) 50000 units CAPS capsule Take 50,000 Units by mouth every Wednesday. 01/23/15  Yes Historical Provider, MD  vitamin E 400 UNIT capsule Take 400 Units by mouth daily.   Yes Historical Provider, MD  MOVIPREP 100 G SOLR Moviprep as directed, no substitutions 09/05/13   Hilarie FredricksonJohn N Perry, MD   BP 154/87 mmHg  Pulse 80  Temp(Src) 98.6 F (37 C) (Oral)  Resp 18  SpO2 100% Physical Exam  Constitutional: She is oriented to person, place, and time. She appears well-developed and well-nourished. No distress.  HENT:  Head: Normocephalic and atraumatic.  Mouth/Throat: Oropharynx is clear and moist.  Eyes: Pupils are equal, round, and reactive to light.  Neck: Normal range of motion. Neck supple.  Cardiovascular: Normal rate, regular rhythm and normal heart sounds.  Exam reveals no gallop and no friction rub.   No murmur heard. Pulmonary/Chest: Effort normal and breath sounds normal. No respiratory distress.  Neurological: She is alert and oriented to person, place, and time. She has normal strength. No cranial nerve deficit or sensory deficit. She exhibits normal muscle tone. Coordination and gait normal. GCS eye subscore is 4. GCS verbal subscore is 5. GCS motor subscore is 6.  Patient has normal heel to shin and finger to nose testing on examination  Skin: Skin is warm and dry. No rash noted. No erythema.  Nursing note and vitals reviewed.   ED Course  Procedures (including critical care time) Labs Review Labs Reviewed   CBC - Abnormal; Notable for the following:    MCH 24.9 (*)    RDW 17.2 (*)    All other components within normal limits  COMPREHENSIVE METABOLIC PANEL - Abnormal; Notable for the following:    Total Bilirubin 0.2 (*)    All other components within normal limits  I-STAT CHEM 8, ED - Abnormal; Notable for the following:    Hemoglobin 15.6 (*)    All other components within normal limits  PROTIME-INR  APTT  DIFFERENTIAL  I-STAT TROPOININ, ED  CBG MONITORING, ED    Imaging Review Ct Head Wo Contrast  04/05/2015  CLINICAL DATA:  Dizziness and nausea.  Stroke symptoms. EXAM: CT HEAD WITHOUT CONTRAST TECHNIQUE: Contiguous axial images were obtained from the base of the skull through the vertex without intravenous contrast.  COMPARISON:  None. FINDINGS: Mild cerebral atrophy. No ventricular dilatation. Small extra-axial calcification along the right posterior frontal region likely represents a small meningioma. This measures about 12 mm maximal dimension. No mass effect or midline shift. No abnormal extra-axial fluid collections. Gray-white matter junctions are distinct. Basal cisterns are not effaced. No evidence of acute intracranial hemorrhage. No depressed skull fractures. Visualized paranasal sinuses and mastoid air cells are not opacified. IMPRESSION: No acute intracranial abnormalities. Probable small extra-axial meningioma at the right posterior frontal region. Electronically Signed   By: Burman Nieves M.D.   On: 04/05/2015 22:51   I have personally reviewed and evaluated these images and lab results as part of my medical decision-making.   EKG Interpretation   Date/Time:  Sunday April 05 2015 20:52:28 EDT Ventricular Rate:  77 PR Interval:  144 QRS Duration: 156 QT Interval:  406 QTC Calculation: 459 R Axis:   -40 Text Interpretation:  Normal sinus rhythm Left axis deviation Right bundle  branch block Left ventricular hypertrophy Abnormal ECG Confirmed by  Bebe Shaggy  MD, Dorinda Hill  [phone]) on 04/06/2015 12:24:50 AM       The patient was ambulated without difficulty and she has normal fine motor testing on examination, this seems less likely with a posterior circulation stroke, but this still could represent this as a possible etiology for her dizziness.  I advised her it seems less likely to be this as the cause of her dizziness and it probably is positional vertigo.  I will have her follow-up with her primary care Dr. told to return here as needed.  The patient is advised that she does need to return here if her condition worsens.  Charlestine Night, PA-C 04/06/15 0050  Linwood Dibbles, MD 04/06/15 [phone]

## 2015-04-18 DIAGNOSIS — M25571 Pain in right ankle and joints of right foot: Secondary | ICD-10-CM | POA: Diagnosis not present

## 2015-04-27 DIAGNOSIS — M25571 Pain in right ankle and joints of right foot: Secondary | ICD-10-CM | POA: Diagnosis not present

## 2015-04-27 DIAGNOSIS — M7661 Achilles tendinitis, right leg: Secondary | ICD-10-CM | POA: Diagnosis not present

## 2015-04-27 DIAGNOSIS — M5442 Lumbago with sciatica, left side: Secondary | ICD-10-CM | POA: Diagnosis not present

## 2015-04-27 DIAGNOSIS — M7731 Calcaneal spur, right foot: Secondary | ICD-10-CM | POA: Diagnosis not present

## 2015-05-04 DIAGNOSIS — F329 Major depressive disorder, single episode, unspecified: Secondary | ICD-10-CM | POA: Diagnosis not present

## 2015-05-04 DIAGNOSIS — G4700 Insomnia, unspecified: Secondary | ICD-10-CM | POA: Diagnosis not present

## 2015-05-04 DIAGNOSIS — F900 Attention-deficit hyperactivity disorder, predominantly inattentive type: Secondary | ICD-10-CM | POA: Diagnosis not present

## 2015-05-04 DIAGNOSIS — F419 Anxiety disorder, unspecified: Secondary | ICD-10-CM | POA: Diagnosis not present

## 2015-05-07 DIAGNOSIS — S161XXA Strain of muscle, fascia and tendon at neck level, initial encounter: Secondary | ICD-10-CM | POA: Diagnosis not present

## 2015-05-07 DIAGNOSIS — M62838 Other muscle spasm: Secondary | ICD-10-CM | POA: Diagnosis not present

## 2015-05-12 DIAGNOSIS — M6248 Contracture of muscle, other site: Secondary | ICD-10-CM | POA: Diagnosis not present

## 2015-05-23 DIAGNOSIS — R21 Rash and other nonspecific skin eruption: Secondary | ICD-10-CM | POA: Diagnosis not present

## 2015-06-16 DIAGNOSIS — L509 Urticaria, unspecified: Secondary | ICD-10-CM | POA: Diagnosis not present

## 2015-11-03 ENCOUNTER — Other Ambulatory Visit: Payer: Self-pay | Admitting: Physician Assistant

## 2015-11-03 DIAGNOSIS — R5383 Other fatigue: Secondary | ICD-10-CM | POA: Diagnosis not present

## 2015-11-03 DIAGNOSIS — F419 Anxiety disorder, unspecified: Secondary | ICD-10-CM | POA: Diagnosis not present

## 2015-11-03 DIAGNOSIS — F329 Major depressive disorder, single episode, unspecified: Secondary | ICD-10-CM | POA: Diagnosis not present

## 2015-11-03 DIAGNOSIS — Z1231 Encounter for screening mammogram for malignant neoplasm of breast: Secondary | ICD-10-CM

## 2015-11-03 DIAGNOSIS — Z23 Encounter for immunization: Secondary | ICD-10-CM | POA: Diagnosis not present

## 2015-11-03 DIAGNOSIS — G4700 Insomnia, unspecified: Secondary | ICD-10-CM | POA: Diagnosis not present

## 2015-11-03 DIAGNOSIS — Z0000 Encounter for general adult medical examination without abnormal findings: Secondary | ICD-10-CM | POA: Diagnosis not present

## 2015-11-04 DIAGNOSIS — M722 Plantar fascial fibromatosis: Secondary | ICD-10-CM | POA: Diagnosis not present

## 2015-11-04 DIAGNOSIS — M7731 Calcaneal spur, right foot: Secondary | ICD-10-CM | POA: Diagnosis not present

## 2015-11-04 DIAGNOSIS — M25571 Pain in right ankle and joints of right foot: Secondary | ICD-10-CM | POA: Diagnosis not present

## 2015-11-13 DIAGNOSIS — M6701 Short Achilles tendon (acquired), right ankle: Secondary | ICD-10-CM | POA: Diagnosis not present

## 2015-11-13 DIAGNOSIS — M7661 Achilles tendinitis, right leg: Secondary | ICD-10-CM | POA: Diagnosis not present

## 2015-11-13 DIAGNOSIS — M9261 Juvenile osteochondrosis of tarsus, right ankle: Secondary | ICD-10-CM | POA: Diagnosis not present

## 2015-11-17 ENCOUNTER — Ambulatory Visit
Admission: RE | Admit: 2015-11-17 | Discharge: 2015-11-17 | Disposition: A | Discharge status: Home / Self Care | Payer: BLUE CROSS/BLUE SHIELD | Source: Ambulatory Visit | Attending: Physician Assistant | Admitting: Physician Assistant

## 2015-11-17 DIAGNOSIS — Z1231 Encounter for screening mammogram for malignant neoplasm of breast: Secondary | ICD-10-CM

## 2015-11-25 DIAGNOSIS — Z96651 Presence of right artificial knee joint: Secondary | ICD-10-CM | POA: Diagnosis not present

## 2015-11-25 DIAGNOSIS — Z471 Aftercare following joint replacement surgery: Secondary | ICD-10-CM | POA: Diagnosis not present

## 2016-04-26 DIAGNOSIS — Z471 Aftercare following joint replacement surgery: Secondary | ICD-10-CM | POA: Diagnosis not present

## 2016-04-26 DIAGNOSIS — M7662 Achilles tendinitis, left leg: Secondary | ICD-10-CM | POA: Diagnosis not present

## 2016-04-26 DIAGNOSIS — M25461 Effusion, right knee: Secondary | ICD-10-CM | POA: Diagnosis not present

## 2016-04-26 DIAGNOSIS — Z96651 Presence of right artificial knee joint: Secondary | ICD-10-CM | POA: Diagnosis not present

## 2016-05-02 DIAGNOSIS — S93602A Unspecified sprain of left foot, initial encounter: Secondary | ICD-10-CM | POA: Diagnosis not present

## 2016-05-30 ENCOUNTER — Other Ambulatory Visit: Payer: Self-pay | Admitting: Physician Assistant

## 2016-05-30 ENCOUNTER — Ambulatory Visit
Admission: RE | Admit: 2016-05-30 | Discharge: 2016-05-30 | Disposition: A | Discharge status: Home / Self Care | Payer: BLUE CROSS/BLUE SHIELD | Source: Ambulatory Visit | Attending: Physician Assistant | Admitting: Physician Assistant

## 2016-05-30 DIAGNOSIS — M25532 Pain in left wrist: Secondary | ICD-10-CM | POA: Diagnosis not present

## 2016-05-30 DIAGNOSIS — W19XXXA Unspecified fall, initial encounter: Secondary | ICD-10-CM

## 2016-05-30 DIAGNOSIS — S62102A Fracture of unspecified carpal bone, left wrist, initial encounter for closed fracture: Secondary | ICD-10-CM | POA: Diagnosis not present

## 2016-05-30 DIAGNOSIS — M25539 Pain in unspecified wrist: Secondary | ICD-10-CM | POA: Diagnosis not present

## 2016-05-30 DIAGNOSIS — S62112A Displaced fracture of triquetrum [cuneiform] bone, left wrist, initial encounter for closed fracture: Secondary | ICD-10-CM | POA: Diagnosis not present

## 2016-06-07 DIAGNOSIS — F900 Attention-deficit hyperactivity disorder, predominantly inattentive type: Secondary | ICD-10-CM | POA: Diagnosis not present

## 2016-06-07 DIAGNOSIS — F329 Major depressive disorder, single episode, unspecified: Secondary | ICD-10-CM | POA: Diagnosis not present

## 2016-06-07 DIAGNOSIS — G4700 Insomnia, unspecified: Secondary | ICD-10-CM | POA: Diagnosis not present

## 2016-06-07 DIAGNOSIS — F419 Anxiety disorder, unspecified: Secondary | ICD-10-CM | POA: Diagnosis not present

## 2016-06-15 DIAGNOSIS — S62112D Displaced fracture of triquetrum [cuneiform] bone, left wrist, subsequent encounter for fracture with routine healing: Secondary | ICD-10-CM | POA: Diagnosis not present

## 2016-07-01 DIAGNOSIS — M25532 Pain in left wrist: Secondary | ICD-10-CM | POA: Diagnosis not present

## 2016-07-01 DIAGNOSIS — M67332 Transient synovitis, left wrist: Secondary | ICD-10-CM | POA: Diagnosis not present

## 2016-07-01 DIAGNOSIS — S62112D Displaced fracture of triquetrum [cuneiform] bone, left wrist, subsequent encounter for fracture with routine healing: Secondary | ICD-10-CM | POA: Diagnosis not present

## 2016-07-11 DIAGNOSIS — S62112D Displaced fracture of triquetrum [cuneiform] bone, left wrist, subsequent encounter for fracture with routine healing: Secondary | ICD-10-CM | POA: Diagnosis not present

## 2016-07-14 DIAGNOSIS — R5383 Other fatigue: Secondary | ICD-10-CM | POA: Diagnosis not present

## 2016-07-14 DIAGNOSIS — R609 Edema, unspecified: Secondary | ICD-10-CM | POA: Diagnosis not present

## 2016-07-14 DIAGNOSIS — R110 Nausea: Secondary | ICD-10-CM | POA: Diagnosis not present

## 2016-07-14 DIAGNOSIS — M1180 Other specified crystal arthropathies, unspecified site: Secondary | ICD-10-CM | POA: Diagnosis not present

## 2016-07-22 DIAGNOSIS — S62112D Displaced fracture of triquetrum [cuneiform] bone, left wrist, subsequent encounter for fracture with routine healing: Secondary | ICD-10-CM | POA: Diagnosis not present

## 2016-07-22 DIAGNOSIS — M25532 Pain in left wrist: Secondary | ICD-10-CM | POA: Diagnosis not present

## 2016-07-22 DIAGNOSIS — M67332 Transient synovitis, left wrist: Secondary | ICD-10-CM | POA: Diagnosis not present

## 2016-08-22 DIAGNOSIS — M67332 Transient synovitis, left wrist: Secondary | ICD-10-CM | POA: Diagnosis not present

## 2016-08-22 DIAGNOSIS — M25532 Pain in left wrist: Secondary | ICD-10-CM | POA: Diagnosis not present

## 2016-08-22 DIAGNOSIS — S62112D Displaced fracture of triquetrum [cuneiform] bone, left wrist, subsequent encounter for fracture with routine healing: Secondary | ICD-10-CM | POA: Diagnosis not present

## 2016-09-14 DIAGNOSIS — M67332 Transient synovitis, left wrist: Secondary | ICD-10-CM | POA: Diagnosis not present

## 2016-09-17 ENCOUNTER — Ambulatory Visit (HOSPITAL_COMMUNITY)
Admission: EM | Admit: 2016-09-17 | Discharge: 2016-09-17 | Disposition: A | Discharge status: Home / Self Care | Payer: BLUE CROSS/BLUE SHIELD | Attending: Radiology | Admitting: Radiology

## 2016-09-17 ENCOUNTER — Encounter (HOSPITAL_COMMUNITY): Payer: Self-pay | Admitting: Family Medicine

## 2016-09-17 DIAGNOSIS — M791 Myalgia, unspecified site: Secondary | ICD-10-CM

## 2016-09-17 DIAGNOSIS — R202 Paresthesia of skin: Secondary | ICD-10-CM

## 2016-09-17 MED ORDER — KETOROLAC TROMETHAMINE 30 MG/ML IJ SOLN
30.0000 mg | Freq: Once | INTRAMUSCULAR | Status: AC
  Administered 2016-09-17: 30 mg via INTRAMUSCULAR

## 2016-09-17 MED ORDER — KETOROLAC TROMETHAMINE 30 MG/ML IJ SOLN
INTRAMUSCULAR | Status: AC
  Filled 2016-09-17: qty 1

## 2016-09-17 NOTE — ED Provider Notes (Signed)
[facility]    CSN: [id number] Arrival date & time: 09/17/16  1355     History   Chief Complaint Chief Complaint  Patient presents with  . Generalized Body Aches    HPI Latasha Evans is a 56 y.o. female.   56 y.o. female presents with generalized muscle aches and tingling to bilateral hands. Condition is chronic in nature. Condition is made better by nothing. Condition is made worse by nothing. Patient denies any relief from treatment prior to there arrival at this facility. Patient states that she was diagnosed with OA and prescribed molixacm but does not take it as prescribed with last dose being 2 days ago. Patient states that she was seen 2 days ago for follow up for fractured risk and told she should be worked up for RA. No neurological defiicts noted, no decrease in senstativity. Grip stregnth in left hand less than in right due to fracture.        Past Medical History:  Diagnosis Date  . ADD (attention deficit disorder)   . Allergy   . Anemia    before menopause  . Anxiety   . Arthritis   . Dysrhythmia    RBBB  . Toothache    broken molar - per dentist needs root canal    Patient Active Problem List   Diagnosis Date Noted  . Expected blood loss anemia 05/09/2012  . Hyponatremia 05/09/2012  . Obesity (BMI 30-39.9) 05/08/2012  . 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 CHOLECYSTECTOMY    . FOOT SURGERY Right    hammertoe correction with repair fracture second toe  . HEMORROIDECTOMY    . KNEE SURGERY Right    x3 2012, 2013, 2014  . PARTIAL KNEE ARTHROPLASTY Right 05/07/2012   Procedure: RIGHT LATERAL UNICOMPARTMENTAL KNEE;  Surgeon: [name], MD;  Location: [facility];  Service: Orthopedics;  Laterality: Right;  . ROUX-EN-Y GASTRIC BYPASS      OB History    No data available       Home Medications    Prior to Admission medications   Medication Sig Start Date End Date Taking? Authorizing  Provider  amphetamine-dextroamphetamine (ADDERALL XR) 25 MG 24 hr capsule Take 50 mg by mouth every morning.    [provider]  b complex vitamins tablet Take 1 tablet by mouth daily.    [provider]  CALCIUM CITRATE-VITAMIN D3 PO Take 4 tablets by mouth 2 (two) times daily.    [provider]  diazepam (VALIUM) 5 MG tablet Take 1 tablet (5 mg total) by mouth every 8 (eight) hours as needed. 04/06/15   Lawyer, Christopher, PA-C  Flaxseed, Linseed, (FLAX SEEDS PO) Take 1 tablet by mouth daily.     [provider]  fluticasone (FLONASE) 50 MCG/ACT nasal spray Place 2 sprays into the nose daily as needed for allergies.    [provider]  glucosamine-chondroitin 500-400 MG tablet Take 3 tablets by mouth every morning.    [provider]  HYDROcodone-acetaminophen (NORCO/VICODIN) 5-325 MG tablet Take 1 tablet by mouth every 6 (six) hours as needed. for pain 01/30/15   [provider]  meclizine (ANTIVERT) 25 MG tablet Take 50 mg by mouth 3 (three) times daily as needed for dizziness.    [provider]  meloxicam (MOBIC) 15 MG tablet Take 15 mg by mouth daily as needed for pain.     [provider]  MOVIPREP  100 G SOLR Moviprep as directed, no substitutions 09/05/13   Hilarie Fredrickson, MD  Multiple Vitamin (MULTIVITAMIN WITH MINERALS) TABS Take 1 tablet by mouth at bedtime.    [provider]  nitroGLYCERIN (NITRODUR - DOSED IN MG/24 HR) 0.1 mg/hr patch Place 0.1 mg onto the skin daily.    [provider]  Omega-3 Fatty Acids (FISH OIL PO) Take 1 capsule by mouth daily.     [provider]  tiZANidine (ZANAFLEX) 4 MG capsule Take 1 capsule (4 mg total) by mouth 3 (three) times daily. Muscle spasms Patient taking differently: Take 4 mg by mouth 3 (three) times daily as needed for muscle spasms. Muscle spasms 05/09/12   Lanney Gins, PA-C  traZODone (DESYREL) 50 MG tablet Take 150 mg by mouth at  bedtime.    [provider]  vitamin A 40981 UNIT capsule Take 10,000 Units by mouth daily.    [provider]  vitamin C (ASCORBIC ACID) 500 MG tablet Take 500 mg by mouth at bedtime.    [provider]  Vitamin D, Ergocalciferol, (DRISDOL) 50000 units CAPS capsule Take 50,000 Units by mouth every Wednesday. 01/23/15   [provider]  vitamin E 400 UNIT capsule Take 400 Units by mouth daily.    [provider]    Family History Family History  Problem Relation Age of Onset  . Colon cancer Neg Hx   . Esophageal cancer Neg Hx   . Stomach cancer Neg Hx   . Rectal cancer Neg Hx     Social History Social History  Substance Use Topics  . Smoking status: Never Smoker  . Smokeless tobacco: Never Used  . Alcohol use Yes     Comment: socially- wine     Allergies   Codeine and Adhesive [tape]   Review of Systems Review of Systems  Constitutional: Negative for chills and fever.  HENT: Negative for ear pain and sore throat.   Eyes: Negative for pain and visual disturbance.  Respiratory: Negative for cough and shortness of breath.   Cardiovascular: Negative for chest pain and palpitations.  Gastrointestinal: Negative for abdominal pain and vomiting.  Genitourinary: Negative for dysuria and hematuria.  Musculoskeletal: Positive for arthralgias (generalized.). Negative for back pain.  Skin: Negative for color change and rash.  Neurological: Positive for numbness ( tingling in bilateral hands). Negative for seizures and syncope.  All other systems reviewed and are negative.    Physical Exam Triage Vital Signs ED Triage Vitals  Enc Vitals Group     BP 09/17/16 1546 112/63     Pulse Rate 09/17/16 1546 84     Resp 09/17/16 1546 18     Temp 09/17/16 1546 98.5 F (36.9 C)     Temp src --      SpO2 09/17/16 1546 100 %     Weight --      Height --      Head Circumference --      Peak Flow --      Pain Score 09/17/16 1548 8     Pain  Loc --      Pain Edu? --      Excl. in GC? --    No data found.   Updated Vital Signs BP 112/63 (BP Location: Left Arm)   Pulse 84   Temp 98.5 F (36.9 C)   Resp 18   SpO2 100%   Visual Acuity Right Eye Distance:   Left Eye Distance:   Bilateral  Distance:    Right Eye Near:   Left Eye Near:    Bilateral Near:     Physical Exam  Constitutional: She appears well-developed and well-nourished. No distress.  HENT:  Head: Normocephalic and atraumatic.  Eyes: Conjunctivae are normal.  Neck: Neck supple.  Cardiovascular: Normal rate and regular rhythm.   No murmur heard. Pulmonary/Chest: Effort normal and breath sounds normal. No respiratory distress.  Abdominal: Soft. There is no tenderness.  Musculoskeletal: She exhibits no edema.  Neurological: She is alert.  Grip strength greater in right. Fracture noted to left.   Skin: Skin is warm and dry.  Psychiatric: She has a normal mood and affect.  Nursing note and vitals reviewed.    UC Treatments / Results  Labs (all labs ordered are listed, but only abnormal results are displayed) Labs Reviewed - No data to display  EKG  EKG Interpretation None       Radiology No results found.  Procedures Procedures (including critical care time)  Medications Ordered in UC Medications - No data to display   Initial Impression / Assessment and Plan / UC Course  I have reviewed the triage vital signs and the nursing notes.  Pertinent labs & imaging results that were available during my care of the patient were reviewed by me and considered in my medical decision making (see chart for details).       Final Clinical Impressions(s) / UC Diagnoses   Final diagnoses:  None    New Prescriptions New Prescriptions   No medications on file     Controlled Substance Prescriptions [place] Controlled Substance Registry consulted? Not Applicable   Alene MiresOmohundro, Bevan Disney C, NP 09/17/16 1623

## 2016-09-17 NOTE — ED Triage Notes (Signed)
Pt here for aching all over body and right arm and hand numbness x 3 days.

## 2016-09-17 NOTE — Discharge Instructions (Signed)
Takermeloxicam as directed

## 2016-09-18 DIAGNOSIS — R0602 Shortness of breath: Secondary | ICD-10-CM | POA: Diagnosis not present

## 2016-09-18 DIAGNOSIS — M2550 Pain in unspecified joint: Secondary | ICD-10-CM | POA: Diagnosis not present

## 2016-09-18 DIAGNOSIS — E876 Hypokalemia: Secondary | ICD-10-CM | POA: Diagnosis not present

## 2016-09-18 DIAGNOSIS — F419 Anxiety disorder, unspecified: Secondary | ICD-10-CM | POA: Diagnosis not present

## 2016-09-18 DIAGNOSIS — M791 Myalgia: Secondary | ICD-10-CM | POA: Diagnosis not present

## 2016-09-18 DIAGNOSIS — Z791 Long term (current) use of non-steroidal anti-inflammatories (NSAID): Secondary | ICD-10-CM | POA: Diagnosis not present

## 2016-09-18 DIAGNOSIS — Z885 Allergy status to narcotic agent status: Secondary | ICD-10-CM | POA: Diagnosis not present

## 2016-09-18 DIAGNOSIS — Z79899 Other long term (current) drug therapy: Secondary | ICD-10-CM | POA: Diagnosis not present

## 2016-09-23 DIAGNOSIS — R030 Elevated blood-pressure reading, without diagnosis of hypertension: Secondary | ICD-10-CM | POA: Diagnosis not present

## 2016-09-23 DIAGNOSIS — R768 Other specified abnormal immunological findings in serum: Secondary | ICD-10-CM | POA: Diagnosis not present

## 2016-09-23 DIAGNOSIS — E876 Hypokalemia: Secondary | ICD-10-CM | POA: Diagnosis not present

## 2016-09-23 DIAGNOSIS — M67332 Transient synovitis, left wrist: Secondary | ICD-10-CM | POA: Diagnosis not present

## 2016-09-23 DIAGNOSIS — M2550 Pain in unspecified joint: Secondary | ICD-10-CM | POA: Diagnosis not present

## 2016-09-27 DIAGNOSIS — M79603 Pain in arm, unspecified: Secondary | ICD-10-CM | POA: Diagnosis not present

## 2016-09-27 DIAGNOSIS — E876 Hypokalemia: Secondary | ICD-10-CM | POA: Diagnosis not present

## 2016-09-27 DIAGNOSIS — Z23 Encounter for immunization: Secondary | ICD-10-CM | POA: Diagnosis not present

## 2016-09-30 DIAGNOSIS — E876 Hypokalemia: Secondary | ICD-10-CM | POA: Diagnosis not present

## 2016-11-16 DIAGNOSIS — E669 Obesity, unspecified: Secondary | ICD-10-CM | POA: Diagnosis not present

## 2016-11-16 DIAGNOSIS — Z6837 Body mass index (BMI) 37.0-37.9, adult: Secondary | ICD-10-CM | POA: Diagnosis not present

## 2016-11-16 DIAGNOSIS — R635 Abnormal weight gain: Secondary | ICD-10-CM | POA: Diagnosis not present

## 2016-11-17 DIAGNOSIS — M1990 Unspecified osteoarthritis, unspecified site: Secondary | ICD-10-CM | POA: Diagnosis not present

## 2016-11-17 DIAGNOSIS — Z6837 Body mass index (BMI) 37.0-37.9, adult: Secondary | ICD-10-CM | POA: Diagnosis not present

## 2016-11-17 DIAGNOSIS — M2550 Pain in unspecified joint: Secondary | ICD-10-CM | POA: Diagnosis not present

## 2016-11-17 DIAGNOSIS — I10 Essential (primary) hypertension: Secondary | ICD-10-CM | POA: Diagnosis not present

## 2016-11-17 DIAGNOSIS — E669 Obesity, unspecified: Secondary | ICD-10-CM | POA: Diagnosis not present

## 2016-11-17 DIAGNOSIS — M533 Sacrococcygeal disorders, not elsewhere classified: Secondary | ICD-10-CM | POA: Diagnosis not present

## 2016-11-17 DIAGNOSIS — M791 Myalgia, unspecified site: Secondary | ICD-10-CM | POA: Diagnosis not present

## 2016-12-07 DIAGNOSIS — Z9884 Bariatric surgery status: Secondary | ICD-10-CM | POA: Diagnosis not present

## 2016-12-07 DIAGNOSIS — E569 Vitamin deficiency, unspecified: Secondary | ICD-10-CM | POA: Diagnosis not present

## 2016-12-07 DIAGNOSIS — Z6836 Body mass index (BMI) 36.0-36.9, adult: Secondary | ICD-10-CM | POA: Diagnosis not present

## 2016-12-07 DIAGNOSIS — E669 Obesity, unspecified: Secondary | ICD-10-CM | POA: Diagnosis not present

## 2016-12-07 DIAGNOSIS — I10 Essential (primary) hypertension: Secondary | ICD-10-CM | POA: Diagnosis not present

## 2016-12-15 DIAGNOSIS — Z713 Dietary counseling and surveillance: Secondary | ICD-10-CM | POA: Diagnosis not present

## 2016-12-15 DIAGNOSIS — E669 Obesity, unspecified: Secondary | ICD-10-CM | POA: Diagnosis not present

## 2016-12-21 DIAGNOSIS — M533 Sacrococcygeal disorders, not elsewhere classified: Secondary | ICD-10-CM | POA: Diagnosis not present

## 2016-12-21 DIAGNOSIS — M2550 Pain in unspecified joint: Secondary | ICD-10-CM | POA: Diagnosis not present

## 2016-12-21 DIAGNOSIS — M791 Myalgia, unspecified site: Secondary | ICD-10-CM | POA: Diagnosis not present

## 2016-12-21 DIAGNOSIS — M458 Ankylosing spondylitis sacral and sacrococcygeal region: Secondary | ICD-10-CM | POA: Diagnosis not present

## 2016-12-21 DIAGNOSIS — M7661 Achilles tendinitis, right leg: Secondary | ICD-10-CM | POA: Diagnosis not present

## 2017-01-31 DIAGNOSIS — E669 Obesity, unspecified: Secondary | ICD-10-CM | POA: Diagnosis not present

## 2017-01-31 DIAGNOSIS — Z6836 Body mass index (BMI) 36.0-36.9, adult: Secondary | ICD-10-CM | POA: Diagnosis not present

## 2017-01-31 DIAGNOSIS — E60 Dietary zinc deficiency: Secondary | ICD-10-CM | POA: Diagnosis not present

## 2017-01-31 DIAGNOSIS — I10 Essential (primary) hypertension: Secondary | ICD-10-CM | POA: Diagnosis not present

## 2017-02-21 DIAGNOSIS — M791 Myalgia, unspecified site: Secondary | ICD-10-CM | POA: Diagnosis not present

## 2017-02-21 DIAGNOSIS — M458 Ankylosing spondylitis sacral and sacrococcygeal region: Secondary | ICD-10-CM | POA: Diagnosis not present

## 2017-03-03 DIAGNOSIS — Z13220 Encounter for screening for lipoid disorders: Secondary | ICD-10-CM | POA: Diagnosis not present

## 2017-03-03 DIAGNOSIS — Z0000 Encounter for general adult medical examination without abnormal findings: Secondary | ICD-10-CM | POA: Diagnosis not present

## 2017-03-03 DIAGNOSIS — F419 Anxiety disorder, unspecified: Secondary | ICD-10-CM | POA: Diagnosis not present

## 2017-03-03 DIAGNOSIS — F329 Major depressive disorder, single episode, unspecified: Secondary | ICD-10-CM | POA: Diagnosis not present

## 2017-06-21 DIAGNOSIS — M150 Primary generalized (osteo)arthritis: Secondary | ICD-10-CM | POA: Diagnosis not present

## 2017-06-21 DIAGNOSIS — M2550 Pain in unspecified joint: Secondary | ICD-10-CM | POA: Diagnosis not present

## 2017-06-21 DIAGNOSIS — M791 Myalgia, unspecified site: Secondary | ICD-10-CM | POA: Diagnosis not present

## 2017-06-21 DIAGNOSIS — M458 Ankylosing spondylitis sacral and sacrococcygeal region: Secondary | ICD-10-CM | POA: Diagnosis not present

## 2017-07-09 DIAGNOSIS — R55 Syncope and collapse: Secondary | ICD-10-CM | POA: Diagnosis not present

## 2017-07-09 DIAGNOSIS — Z743 Need for continuous supervision: Secondary | ICD-10-CM | POA: Diagnosis not present

## 2017-07-12 DIAGNOSIS — R5383 Other fatigue: Secondary | ICD-10-CM | POA: Diagnosis not present

## 2017-09-19 DIAGNOSIS — F419 Anxiety disorder, unspecified: Secondary | ICD-10-CM | POA: Diagnosis not present

## 2017-09-19 DIAGNOSIS — Z23 Encounter for immunization: Secondary | ICD-10-CM | POA: Diagnosis not present

## 2017-09-19 DIAGNOSIS — F900 Attention-deficit hyperactivity disorder, predominantly inattentive type: Secondary | ICD-10-CM | POA: Diagnosis not present

## 2017-09-19 DIAGNOSIS — F339 Major depressive disorder, recurrent, unspecified: Secondary | ICD-10-CM | POA: Diagnosis not present

## 2017-09-19 DIAGNOSIS — G4700 Insomnia, unspecified: Secondary | ICD-10-CM | POA: Diagnosis not present

## 2017-12-06 DIAGNOSIS — L989 Disorder of the skin and subcutaneous tissue, unspecified: Secondary | ICD-10-CM | POA: Diagnosis not present

## 2017-12-06 DIAGNOSIS — D485 Neoplasm of uncertain behavior of skin: Secondary | ICD-10-CM | POA: Diagnosis not present

## 2017-12-06 DIAGNOSIS — L659 Nonscarring hair loss, unspecified: Secondary | ICD-10-CM | POA: Diagnosis not present

## 2017-12-18 DIAGNOSIS — L659 Nonscarring hair loss, unspecified: Secondary | ICD-10-CM | POA: Diagnosis not present

## 2017-12-19 DIAGNOSIS — M150 Primary generalized (osteo)arthritis: Secondary | ICD-10-CM | POA: Diagnosis not present

## 2017-12-19 DIAGNOSIS — M458 Ankylosing spondylitis sacral and sacrococcygeal region: Secondary | ICD-10-CM | POA: Diagnosis not present

## 2017-12-19 DIAGNOSIS — M2550 Pain in unspecified joint: Secondary | ICD-10-CM | POA: Diagnosis not present

## 2017-12-19 DIAGNOSIS — M533 Sacrococcygeal disorders, not elsewhere classified: Secondary | ICD-10-CM | POA: Diagnosis not present

## 2018-03-06 DIAGNOSIS — Z131 Encounter for screening for diabetes mellitus: Secondary | ICD-10-CM | POA: Diagnosis not present

## 2018-03-06 DIAGNOSIS — F900 Attention-deficit hyperactivity disorder, predominantly inattentive type: Secondary | ICD-10-CM | POA: Diagnosis not present

## 2018-03-06 DIAGNOSIS — F419 Anxiety disorder, unspecified: Secondary | ICD-10-CM | POA: Diagnosis not present

## 2018-03-06 DIAGNOSIS — Z13220 Encounter for screening for lipoid disorders: Secondary | ICD-10-CM | POA: Diagnosis not present

## 2018-03-06 DIAGNOSIS — G4700 Insomnia, unspecified: Secondary | ICD-10-CM | POA: Diagnosis not present

## 2018-03-06 DIAGNOSIS — Z0000 Encounter for general adult medical examination without abnormal findings: Secondary | ICD-10-CM | POA: Diagnosis not present

## 2018-03-06 DIAGNOSIS — Z6838 Body mass index (BMI) 38.0-38.9, adult: Secondary | ICD-10-CM | POA: Diagnosis not present

## 2018-03-06 DIAGNOSIS — R609 Edema, unspecified: Secondary | ICD-10-CM | POA: Diagnosis not present

## 2018-06-20 DIAGNOSIS — M2550 Pain in unspecified joint: Secondary | ICD-10-CM | POA: Diagnosis not present

## 2018-06-20 DIAGNOSIS — M533 Sacrococcygeal disorders, not elsewhere classified: Secondary | ICD-10-CM | POA: Diagnosis not present

## 2018-06-20 DIAGNOSIS — M150 Primary generalized (osteo)arthritis: Secondary | ICD-10-CM | POA: Diagnosis not present

## 2018-06-20 DIAGNOSIS — M458 Ankylosing spondylitis sacral and sacrococcygeal region: Secondary | ICD-10-CM | POA: Diagnosis not present

## 2018-08-16 DIAGNOSIS — Z20828 Contact with and (suspected) exposure to other viral communicable diseases: Secondary | ICD-10-CM | POA: Diagnosis not present

## 2018-08-27 DIAGNOSIS — M25511 Pain in right shoulder: Secondary | ICD-10-CM | POA: Diagnosis not present

## 2018-12-20 DIAGNOSIS — M458 Ankylosing spondylitis sacral and sacrococcygeal region: Secondary | ICD-10-CM | POA: Diagnosis not present

## 2018-12-20 DIAGNOSIS — M2550 Pain in unspecified joint: Secondary | ICD-10-CM | POA: Diagnosis not present

## 2018-12-20 DIAGNOSIS — M533 Sacrococcygeal disorders, not elsewhere classified: Secondary | ICD-10-CM | POA: Diagnosis not present

## 2018-12-20 DIAGNOSIS — Z23 Encounter for immunization: Secondary | ICD-10-CM | POA: Diagnosis not present

## 2018-12-20 DIAGNOSIS — M150 Primary generalized (osteo)arthritis: Secondary | ICD-10-CM | POA: Diagnosis not present

## 2018-12-31 ENCOUNTER — Other Ambulatory Visit: Payer: Self-pay | Admitting: Physician Assistant

## 2018-12-31 DIAGNOSIS — D72818 Other decreased white blood cell count: Secondary | ICD-10-CM | POA: Diagnosis not present

## 2018-12-31 DIAGNOSIS — Z1231 Encounter for screening mammogram for malignant neoplasm of breast: Secondary | ICD-10-CM

## 2019-02-20 DIAGNOSIS — M533 Sacrococcygeal disorders, not elsewhere classified: Secondary | ICD-10-CM | POA: Diagnosis not present

## 2019-02-20 DIAGNOSIS — M458 Ankylosing spondylitis sacral and sacrococcygeal region: Secondary | ICD-10-CM | POA: Diagnosis not present

## 2019-02-20 DIAGNOSIS — M150 Primary generalized (osteo)arthritis: Secondary | ICD-10-CM | POA: Diagnosis not present

## 2019-02-20 DIAGNOSIS — M2550 Pain in unspecified joint: Secondary | ICD-10-CM | POA: Diagnosis not present

## 2019-04-16 DIAGNOSIS — F419 Anxiety disorder, unspecified: Secondary | ICD-10-CM | POA: Diagnosis not present

## 2019-04-16 DIAGNOSIS — Z13220 Encounter for screening for lipoid disorders: Secondary | ICD-10-CM | POA: Diagnosis not present

## 2019-04-16 DIAGNOSIS — Z0000 Encounter for general adult medical examination without abnormal findings: Secondary | ICD-10-CM | POA: Diagnosis not present

## 2019-04-16 DIAGNOSIS — E559 Vitamin D deficiency, unspecified: Secondary | ICD-10-CM | POA: Diagnosis not present

## 2019-04-16 DIAGNOSIS — F900 Attention-deficit hyperactivity disorder, predominantly inattentive type: Secondary | ICD-10-CM | POA: Diagnosis not present

## 2019-04-16 DIAGNOSIS — F339 Major depressive disorder, recurrent, unspecified: Secondary | ICD-10-CM | POA: Diagnosis not present

## 2019-04-19 ENCOUNTER — Other Ambulatory Visit: Payer: Self-pay | Admitting: Physician Assistant

## 2019-04-19 DIAGNOSIS — Z1231 Encounter for screening mammogram for malignant neoplasm of breast: Secondary | ICD-10-CM

## 2019-05-06 DIAGNOSIS — Z01419 Encounter for gynecological examination (general) (routine) without abnormal findings: Secondary | ICD-10-CM | POA: Diagnosis not present

## 2019-05-21 DIAGNOSIS — M150 Primary generalized (osteo)arthritis: Secondary | ICD-10-CM | POA: Diagnosis not present

## 2019-05-21 DIAGNOSIS — M533 Sacrococcygeal disorders, not elsewhere classified: Secondary | ICD-10-CM | POA: Diagnosis not present

## 2019-05-21 DIAGNOSIS — M2550 Pain in unspecified joint: Secondary | ICD-10-CM | POA: Diagnosis not present

## 2019-05-21 DIAGNOSIS — M458 Ankylosing spondylitis sacral and sacrococcygeal region: Secondary | ICD-10-CM | POA: Diagnosis not present

## 2019-05-31 ENCOUNTER — Ambulatory Visit: Payer: BLUE CROSS/BLUE SHIELD

## 2019-06-10 ENCOUNTER — Ambulatory Visit
Admission: RE | Admit: 2019-06-10 | Discharge: 2019-06-10 | Disposition: A | Discharge status: Home / Self Care | Payer: BC Managed Care – PPO | Source: Ambulatory Visit | Attending: Physician Assistant | Admitting: Physician Assistant

## 2019-06-10 ENCOUNTER — Other Ambulatory Visit: Payer: Self-pay

## 2019-06-10 DIAGNOSIS — Z1231 Encounter for screening mammogram for malignant neoplasm of breast: Secondary | ICD-10-CM | POA: Diagnosis not present

## 2019-06-12 DIAGNOSIS — M940 Chondrocostal junction syndrome [Tietze]: Secondary | ICD-10-CM | POA: Diagnosis not present

## 2019-08-15 DIAGNOSIS — D123 Benign neoplasm of transverse colon: Secondary | ICD-10-CM | POA: Diagnosis not present

## 2019-08-15 DIAGNOSIS — Z1211 Encounter for screening for malignant neoplasm of colon: Secondary | ICD-10-CM | POA: Diagnosis not present

## 2019-08-21 DIAGNOSIS — M533 Sacrococcygeal disorders, not elsewhere classified: Secondary | ICD-10-CM | POA: Diagnosis not present

## 2019-08-21 DIAGNOSIS — M2550 Pain in unspecified joint: Secondary | ICD-10-CM | POA: Diagnosis not present

## 2019-08-21 DIAGNOSIS — M150 Primary generalized (osteo)arthritis: Secondary | ICD-10-CM | POA: Diagnosis not present

## 2019-08-21 DIAGNOSIS — M791 Myalgia, unspecified site: Secondary | ICD-10-CM | POA: Diagnosis not present

## 2019-08-21 DIAGNOSIS — M458 Ankylosing spondylitis sacral and sacrococcygeal region: Secondary | ICD-10-CM | POA: Diagnosis not present

## 2019-09-12 DIAGNOSIS — Z713 Dietary counseling and surveillance: Secondary | ICD-10-CM | POA: Diagnosis not present

## 2019-10-28 DIAGNOSIS — Z713 Dietary counseling and surveillance: Secondary | ICD-10-CM | POA: Diagnosis not present

## 2019-11-18 DIAGNOSIS — Z713 Dietary counseling and surveillance: Secondary | ICD-10-CM | POA: Diagnosis not present

## 2019-11-21 DIAGNOSIS — Z111 Encounter for screening for respiratory tuberculosis: Secondary | ICD-10-CM | POA: Diagnosis not present

## 2019-11-21 DIAGNOSIS — M533 Sacrococcygeal disorders, not elsewhere classified: Secondary | ICD-10-CM | POA: Diagnosis not present

## 2019-11-21 DIAGNOSIS — Z23 Encounter for immunization: Secondary | ICD-10-CM | POA: Diagnosis not present

## 2019-11-21 DIAGNOSIS — M458 Ankylosing spondylitis sacral and sacrococcygeal region: Secondary | ICD-10-CM | POA: Diagnosis not present

## 2019-11-21 DIAGNOSIS — M150 Primary generalized (osteo)arthritis: Secondary | ICD-10-CM | POA: Diagnosis not present

## 2019-11-21 DIAGNOSIS — M2550 Pain in unspecified joint: Secondary | ICD-10-CM | POA: Diagnosis not present

## 2019-12-24 DIAGNOSIS — G4700 Insomnia, unspecified: Secondary | ICD-10-CM | POA: Diagnosis not present

## 2019-12-24 DIAGNOSIS — I10 Essential (primary) hypertension: Secondary | ICD-10-CM | POA: Diagnosis not present

## 2019-12-24 DIAGNOSIS — F900 Attention-deficit hyperactivity disorder, predominantly inattentive type: Secondary | ICD-10-CM | POA: Diagnosis not present

## 2019-12-24 DIAGNOSIS — E559 Vitamin D deficiency, unspecified: Secondary | ICD-10-CM | POA: Diagnosis not present

## 2019-12-24 DIAGNOSIS — F339 Major depressive disorder, recurrent, unspecified: Secondary | ICD-10-CM | POA: Diagnosis not present

## 2019-12-31 DIAGNOSIS — Z713 Dietary counseling and surveillance: Secondary | ICD-10-CM | POA: Diagnosis not present

## 2020-01-17 DIAGNOSIS — Z713 Dietary counseling and surveillance: Secondary | ICD-10-CM | POA: Diagnosis not present

## 2020-02-21 DIAGNOSIS — Z713 Dietary counseling and surveillance: Secondary | ICD-10-CM | POA: Diagnosis not present

## 2020-02-24 DIAGNOSIS — M458 Ankylosing spondylitis sacral and sacrococcygeal region: Secondary | ICD-10-CM | POA: Diagnosis not present

## 2020-02-24 DIAGNOSIS — M533 Sacrococcygeal disorders, not elsewhere classified: Secondary | ICD-10-CM | POA: Diagnosis not present

## 2020-02-24 DIAGNOSIS — M2550 Pain in unspecified joint: Secondary | ICD-10-CM | POA: Diagnosis not present

## 2020-02-24 DIAGNOSIS — M150 Primary generalized (osteo)arthritis: Secondary | ICD-10-CM | POA: Diagnosis not present

## 2020-03-16 DIAGNOSIS — Z713 Dietary counseling and surveillance: Secondary | ICD-10-CM | POA: Diagnosis not present

## 2020-04-10 DIAGNOSIS — Z131 Encounter for screening for diabetes mellitus: Secondary | ICD-10-CM | POA: Diagnosis not present

## 2020-04-10 DIAGNOSIS — E669 Obesity, unspecified: Secondary | ICD-10-CM | POA: Diagnosis not present

## 2020-04-10 DIAGNOSIS — Z1331 Encounter for screening for depression: Secondary | ICD-10-CM | POA: Diagnosis not present

## 2020-04-10 DIAGNOSIS — E7800 Pure hypercholesterolemia, unspecified: Secondary | ICD-10-CM | POA: Diagnosis not present

## 2020-04-10 DIAGNOSIS — R0602 Shortness of breath: Secondary | ICD-10-CM | POA: Diagnosis not present

## 2020-04-10 DIAGNOSIS — R635 Abnormal weight gain: Secondary | ICD-10-CM | POA: Diagnosis not present

## 2020-04-10 DIAGNOSIS — E8881 Metabolic syndrome: Secondary | ICD-10-CM | POA: Diagnosis not present

## 2020-04-10 DIAGNOSIS — E559 Vitamin D deficiency, unspecified: Secondary | ICD-10-CM | POA: Diagnosis not present

## 2020-04-10 DIAGNOSIS — Z79899 Other long term (current) drug therapy: Secondary | ICD-10-CM | POA: Diagnosis not present

## 2020-04-16 DIAGNOSIS — R9431 Abnormal electrocardiogram [ECG] [EKG]: Secondary | ICD-10-CM | POA: Diagnosis not present

## 2020-04-16 DIAGNOSIS — Z713 Dietary counseling and surveillance: Secondary | ICD-10-CM | POA: Diagnosis not present

## 2020-04-16 DIAGNOSIS — I4510 Unspecified right bundle-branch block: Secondary | ICD-10-CM | POA: Diagnosis not present

## 2020-04-22 DIAGNOSIS — F909 Attention-deficit hyperactivity disorder, unspecified type: Secondary | ICD-10-CM | POA: Diagnosis not present

## 2020-04-22 DIAGNOSIS — E669 Obesity, unspecified: Secondary | ICD-10-CM | POA: Diagnosis not present

## 2020-04-22 DIAGNOSIS — R454 Irritability and anger: Secondary | ICD-10-CM | POA: Diagnosis not present

## 2020-04-22 DIAGNOSIS — Z9109 Other allergy status, other than to drugs and biological substances: Secondary | ICD-10-CM | POA: Diagnosis not present

## 2020-04-22 DIAGNOSIS — R400 Somnolence: Secondary | ICD-10-CM | POA: Diagnosis not present

## 2020-05-08 DIAGNOSIS — R942 Abnormal results of pulmonary function studies: Secondary | ICD-10-CM | POA: Diagnosis not present

## 2020-05-08 DIAGNOSIS — Z008 Encounter for other general examination: Secondary | ICD-10-CM | POA: Diagnosis not present

## 2020-05-08 DIAGNOSIS — M459 Ankylosing spondylitis of unspecified sites in spine: Secondary | ICD-10-CM | POA: Diagnosis not present

## 2020-05-08 DIAGNOSIS — Z6836 Body mass index (BMI) 36.0-36.9, adult: Secondary | ICD-10-CM | POA: Diagnosis not present

## 2020-05-13 DIAGNOSIS — E669 Obesity, unspecified: Secondary | ICD-10-CM | POA: Diagnosis not present

## 2020-05-13 DIAGNOSIS — R7303 Prediabetes: Secondary | ICD-10-CM | POA: Diagnosis not present

## 2020-05-13 DIAGNOSIS — R030 Elevated blood-pressure reading, without diagnosis of hypertension: Secondary | ICD-10-CM | POA: Diagnosis not present

## 2020-05-13 DIAGNOSIS — E559 Vitamin D deficiency, unspecified: Secondary | ICD-10-CM | POA: Diagnosis not present

## 2020-06-30 DIAGNOSIS — R030 Elevated blood-pressure reading, without diagnosis of hypertension: Secondary | ICD-10-CM | POA: Diagnosis not present

## 2020-06-30 DIAGNOSIS — E559 Vitamin D deficiency, unspecified: Secondary | ICD-10-CM | POA: Diagnosis not present

## 2020-06-30 DIAGNOSIS — R7301 Impaired fasting glucose: Secondary | ICD-10-CM | POA: Diagnosis not present

## 2020-06-30 DIAGNOSIS — Z6836 Body mass index (BMI) 36.0-36.9, adult: Secondary | ICD-10-CM | POA: Diagnosis not present

## 2020-06-30 DIAGNOSIS — E669 Obesity, unspecified: Secondary | ICD-10-CM | POA: Diagnosis not present

## 2020-07-24 DIAGNOSIS — F339 Major depressive disorder, recurrent, unspecified: Secondary | ICD-10-CM | POA: Diagnosis not present

## 2020-07-24 DIAGNOSIS — Z13220 Encounter for screening for lipoid disorders: Secondary | ICD-10-CM | POA: Diagnosis not present

## 2020-07-24 DIAGNOSIS — I10 Essential (primary) hypertension: Secondary | ICD-10-CM | POA: Diagnosis not present

## 2020-07-24 DIAGNOSIS — Z0000 Encounter for general adult medical examination without abnormal findings: Secondary | ICD-10-CM | POA: Diagnosis not present

## 2020-07-24 DIAGNOSIS — G4700 Insomnia, unspecified: Secondary | ICD-10-CM | POA: Diagnosis not present

## 2020-07-24 DIAGNOSIS — F419 Anxiety disorder, unspecified: Secondary | ICD-10-CM | POA: Diagnosis not present

## 2020-08-24 DIAGNOSIS — M150 Primary generalized (osteo)arthritis: Secondary | ICD-10-CM | POA: Diagnosis not present

## 2020-08-24 DIAGNOSIS — M7661 Achilles tendinitis, right leg: Secondary | ICD-10-CM | POA: Diagnosis not present

## 2020-08-24 DIAGNOSIS — M459 Ankylosing spondylitis of unspecified sites in spine: Secondary | ICD-10-CM | POA: Diagnosis not present

## 2020-08-24 DIAGNOSIS — M2550 Pain in unspecified joint: Secondary | ICD-10-CM | POA: Diagnosis not present

## 2020-11-13 DIAGNOSIS — E519 Thiamine deficiency, unspecified: Secondary | ICD-10-CM | POA: Diagnosis not present

## 2020-11-13 DIAGNOSIS — E789 Disorder of lipoprotein metabolism, unspecified: Secondary | ICD-10-CM | POA: Diagnosis not present

## 2020-11-13 DIAGNOSIS — Z1329 Encounter for screening for other suspected endocrine disorder: Secondary | ICD-10-CM | POA: Diagnosis not present

## 2020-11-13 DIAGNOSIS — Z131 Encounter for screening for diabetes mellitus: Secondary | ICD-10-CM | POA: Diagnosis not present

## 2020-11-24 DIAGNOSIS — G629 Polyneuropathy, unspecified: Secondary | ICD-10-CM | POA: Diagnosis not present

## 2020-11-24 DIAGNOSIS — Z111 Encounter for screening for respiratory tuberculosis: Secondary | ICD-10-CM | POA: Diagnosis not present

## 2020-11-24 DIAGNOSIS — M7661 Achilles tendinitis, right leg: Secondary | ICD-10-CM | POA: Diagnosis not present

## 2020-11-24 DIAGNOSIS — M459 Ankylosing spondylitis of unspecified sites in spine: Secondary | ICD-10-CM | POA: Diagnosis not present

## 2020-11-24 DIAGNOSIS — M150 Primary generalized (osteo)arthritis: Secondary | ICD-10-CM | POA: Diagnosis not present

## 2020-11-24 DIAGNOSIS — M2550 Pain in unspecified joint: Secondary | ICD-10-CM | POA: Diagnosis not present

## 2020-12-02 DIAGNOSIS — M1711 Unilateral primary osteoarthritis, right knee: Secondary | ICD-10-CM | POA: Diagnosis not present

## 2021-02-24 DIAGNOSIS — F900 Attention-deficit hyperactivity disorder, predominantly inattentive type: Secondary | ICD-10-CM | POA: Diagnosis not present

## 2021-02-24 DIAGNOSIS — F339 Major depressive disorder, recurrent, unspecified: Secondary | ICD-10-CM | POA: Diagnosis not present

## 2021-02-24 DIAGNOSIS — I10 Essential (primary) hypertension: Secondary | ICD-10-CM | POA: Diagnosis not present

## 2021-02-24 DIAGNOSIS — M459 Ankylosing spondylitis of unspecified sites in spine: Secondary | ICD-10-CM | POA: Diagnosis not present

## 2021-06-30 DIAGNOSIS — E663 Overweight: Secondary | ICD-10-CM | POA: Diagnosis not present

## 2021-06-30 DIAGNOSIS — E349 Endocrine disorder, unspecified: Secondary | ICD-10-CM | POA: Diagnosis not present

## 2021-06-30 DIAGNOSIS — E559 Vitamin D deficiency, unspecified: Secondary | ICD-10-CM | POA: Diagnosis not present

## 2021-07-02 DIAGNOSIS — M459 Ankylosing spondylitis of unspecified sites in spine: Secondary | ICD-10-CM | POA: Diagnosis not present

## 2021-07-02 DIAGNOSIS — M7661 Achilles tendinitis, right leg: Secondary | ICD-10-CM | POA: Diagnosis not present

## 2021-07-02 DIAGNOSIS — Z79899 Other long term (current) drug therapy: Secondary | ICD-10-CM | POA: Diagnosis not present

## 2021-07-02 DIAGNOSIS — M1991 Primary osteoarthritis, unspecified site: Secondary | ICD-10-CM | POA: Diagnosis not present

## 2021-07-28 DIAGNOSIS — Z1231 Encounter for screening mammogram for malignant neoplasm of breast: Secondary | ICD-10-CM | POA: Diagnosis not present

## 2021-08-24 DIAGNOSIS — F900 Attention-deficit hyperactivity disorder, predominantly inattentive type: Secondary | ICD-10-CM | POA: Diagnosis not present

## 2021-08-24 DIAGNOSIS — I10 Essential (primary) hypertension: Secondary | ICD-10-CM | POA: Diagnosis not present

## 2021-08-24 DIAGNOSIS — Z8249 Family history of ischemic heart disease and other diseases of the circulatory system: Secondary | ICD-10-CM | POA: Diagnosis not present

## 2021-08-24 DIAGNOSIS — Z0000 Encounter for general adult medical examination without abnormal findings: Secondary | ICD-10-CM | POA: Diagnosis not present

## 2021-08-24 DIAGNOSIS — F339 Major depressive disorder, recurrent, unspecified: Secondary | ICD-10-CM | POA: Diagnosis not present

## 2021-08-24 DIAGNOSIS — F419 Anxiety disorder, unspecified: Secondary | ICD-10-CM | POA: Diagnosis not present

## 2021-08-24 DIAGNOSIS — G4700 Insomnia, unspecified: Secondary | ICD-10-CM | POA: Diagnosis not present

## 2021-08-24 DIAGNOSIS — Z6838 Body mass index (BMI) 38.0-38.9, adult: Secondary | ICD-10-CM | POA: Diagnosis not present

## 2021-10-28 DIAGNOSIS — M458 Ankylosing spondylitis sacral and sacrococcygeal region: Secondary | ICD-10-CM | POA: Diagnosis not present

## 2021-10-28 DIAGNOSIS — Z79899 Other long term (current) drug therapy: Secondary | ICD-10-CM | POA: Diagnosis not present

## 2021-12-29 DIAGNOSIS — M459 Ankylosing spondylitis of unspecified sites in spine: Secondary | ICD-10-CM | POA: Diagnosis not present

## 2021-12-29 DIAGNOSIS — M1991 Primary osteoarthritis, unspecified site: Secondary | ICD-10-CM | POA: Diagnosis not present

## 2021-12-29 DIAGNOSIS — Z79899 Other long term (current) drug therapy: Secondary | ICD-10-CM | POA: Diagnosis not present

## 2021-12-29 DIAGNOSIS — R5383 Other fatigue: Secondary | ICD-10-CM | POA: Diagnosis not present

## 2021-12-29 DIAGNOSIS — M458 Ankylosing spondylitis sacral and sacrococcygeal region: Secondary | ICD-10-CM | POA: Diagnosis not present

## 2022-02-24 DIAGNOSIS — J309 Allergic rhinitis, unspecified: Secondary | ICD-10-CM | POA: Diagnosis not present

## 2022-02-24 DIAGNOSIS — F419 Anxiety disorder, unspecified: Secondary | ICD-10-CM | POA: Diagnosis not present

## 2022-02-24 DIAGNOSIS — F900 Attention-deficit hyperactivity disorder, predominantly inattentive type: Secondary | ICD-10-CM | POA: Diagnosis not present

## 2022-02-24 DIAGNOSIS — G4700 Insomnia, unspecified: Secondary | ICD-10-CM | POA: Diagnosis not present

## 2022-04-28 DIAGNOSIS — M458 Ankylosing spondylitis sacral and sacrococcygeal region: Secondary | ICD-10-CM | POA: Diagnosis not present

## 2022-04-28 DIAGNOSIS — Z79899 Other long term (current) drug therapy: Secondary | ICD-10-CM | POA: Diagnosis not present

## 2022-06-29 DIAGNOSIS — M533 Sacrococcygeal disorders, not elsewhere classified: Secondary | ICD-10-CM | POA: Diagnosis not present

## 2022-06-29 DIAGNOSIS — M1991 Primary osteoarthritis, unspecified site: Secondary | ICD-10-CM | POA: Diagnosis not present

## 2022-06-29 DIAGNOSIS — M7661 Achilles tendinitis, right leg: Secondary | ICD-10-CM | POA: Diagnosis not present

## 2022-06-29 DIAGNOSIS — Z79899 Other long term (current) drug therapy: Secondary | ICD-10-CM | POA: Diagnosis not present

## 2022-06-29 DIAGNOSIS — M459 Ankylosing spondylitis of unspecified sites in spine: Secondary | ICD-10-CM | POA: Diagnosis not present

## 2022-08-04 ENCOUNTER — Ambulatory Visit (HOSPITAL_COMMUNITY)
Admission: EM | Admit: 2022-08-04 | Discharge: 2022-08-04 | Disposition: A | Discharge status: Home / Self Care | Payer: BC Managed Care – PPO

## 2022-08-04 ENCOUNTER — Encounter (HOSPITAL_COMMUNITY): Payer: Self-pay | Admitting: *Deleted

## 2022-08-04 DIAGNOSIS — R519 Headache, unspecified: Secondary | ICD-10-CM

## 2022-08-04 MED ORDER — KETOROLAC TROMETHAMINE 30 MG/ML IJ SOLN
30.0000 mg | Freq: Once | INTRAMUSCULAR | Status: AC
  Administered 2022-08-04: 30 mg via INTRAMUSCULAR

## 2022-08-04 MED ORDER — KETOROLAC TROMETHAMINE 30 MG/ML IJ SOLN
INTRAMUSCULAR | Status: AC
  Filled 2022-08-04: qty 1
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
## 2022-08-04 NOTE — Discharge Instructions (Signed)
We have given you an injection of Toradol today to help with pain and the headache.  Please continue to take Tylenol 500-100 mg every 6 hours as needed for pain.  You can also take the tizanidine you have been prescribed previously to help with muscular pain.  Make sure you drink plenty of water tonight.  You will probably wake up tomorrow very sore.  This should improve over the next few days.   Follow up with PCP if pain is not improving.  If symptoms worsen, please seek emergent care.
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
## 2022-09-29 DIAGNOSIS — M459 Ankylosing spondylitis of unspecified sites in spine: Secondary | ICD-10-CM | POA: Diagnosis not present

## 2022-09-29 DIAGNOSIS — M1991 Primary osteoarthritis, unspecified site: Secondary | ICD-10-CM | POA: Diagnosis not present

## 2022-09-29 DIAGNOSIS — Z79899 Other long term (current) drug therapy: Secondary | ICD-10-CM | POA: Diagnosis not present

## 2022-09-29 DIAGNOSIS — M7661 Achilles tendinitis, right leg: Secondary | ICD-10-CM | POA: Diagnosis not present

## 2022-11-29 DIAGNOSIS — Z23 Encounter for immunization: Secondary | ICD-10-CM | POA: Diagnosis not present

## 2022-11-29 DIAGNOSIS — I10 Essential (primary) hypertension: Secondary | ICD-10-CM | POA: Diagnosis not present

## 2022-11-29 DIAGNOSIS — F419 Anxiety disorder, unspecified: Secondary | ICD-10-CM | POA: Diagnosis not present

## 2022-11-29 DIAGNOSIS — F900 Attention-deficit hyperactivity disorder, predominantly inattentive type: Secondary | ICD-10-CM | POA: Diagnosis not present

## 2022-11-29 DIAGNOSIS — Z0000 Encounter for general adult medical examination without abnormal findings: Secondary | ICD-10-CM | POA: Diagnosis not present

## 2023-01-02 DIAGNOSIS — M1991 Primary osteoarthritis, unspecified site: Secondary | ICD-10-CM | POA: Diagnosis not present

## 2023-01-02 DIAGNOSIS — R5383 Other fatigue: Secondary | ICD-10-CM | POA: Diagnosis not present

## 2023-01-02 DIAGNOSIS — Z79899 Other long term (current) drug therapy: Secondary | ICD-10-CM | POA: Diagnosis not present

## 2023-01-02 DIAGNOSIS — M25579 Pain in unspecified ankle and joints of unspecified foot: Secondary | ICD-10-CM | POA: Diagnosis not present

## 2023-01-02 DIAGNOSIS — M459 Ankylosing spondylitis of unspecified sites in spine: Secondary | ICD-10-CM | POA: Diagnosis not present

## 2023-01-30 DIAGNOSIS — Z79899 Other long term (current) drug therapy: Secondary | ICD-10-CM | POA: Diagnosis not present

## 2023-01-30 DIAGNOSIS — D649 Anemia, unspecified: Secondary | ICD-10-CM | POA: Diagnosis not present

## 2023-02-14 DIAGNOSIS — Z79899 Other long term (current) drug therapy: Secondary | ICD-10-CM | POA: Diagnosis not present

## 2023-02-14 DIAGNOSIS — R0789 Other chest pain: Secondary | ICD-10-CM | POA: Diagnosis not present

## 2023-02-23 DIAGNOSIS — Z79899 Other long term (current) drug therapy: Secondary | ICD-10-CM | POA: Diagnosis not present

## 2023-02-23 DIAGNOSIS — M1991 Primary osteoarthritis, unspecified site: Secondary | ICD-10-CM | POA: Diagnosis not present

## 2023-02-23 DIAGNOSIS — M459 Ankylosing spondylitis of unspecified sites in spine: Secondary | ICD-10-CM | POA: Diagnosis not present

## 2023-02-23 DIAGNOSIS — M7661 Achilles tendinitis, right leg: Secondary | ICD-10-CM | POA: Diagnosis not present

## 2023-03-29 ENCOUNTER — Other Ambulatory Visit: Payer: Self-pay | Admitting: Family

## 2023-03-29 DIAGNOSIS — D649 Anemia, unspecified: Secondary | ICD-10-CM

## 2023-03-31 ENCOUNTER — Encounter: Payer: Self-pay | Admitting: Family

## 2023-03-31 ENCOUNTER — Inpatient Hospital Stay: Attending: Hematology & Oncology

## 2023-03-31 ENCOUNTER — Inpatient Hospital Stay: Admitting: Family

## 2023-03-31 VITALS — BP 134/71 | HR 75 | Temp 98.4°F | Resp 18 | Ht 70.0 in | Wt 247.0 lb

## 2023-03-31 DIAGNOSIS — D563 Thalassemia minor: Secondary | ICD-10-CM | POA: Diagnosis not present

## 2023-03-31 DIAGNOSIS — Z807 Family history of other malignant neoplasms of lymphoid, hematopoietic and related tissues: Secondary | ICD-10-CM | POA: Diagnosis not present

## 2023-03-31 DIAGNOSIS — D649 Anemia, unspecified: Secondary | ICD-10-CM

## 2023-03-31 DIAGNOSIS — D509 Iron deficiency anemia, unspecified: Secondary | ICD-10-CM | POA: Diagnosis not present

## 2023-03-31 LAB — CBC WITH DIFFERENTIAL (CANCER CENTER ONLY)
Abs Immature Granulocytes: 0.01 10*3/uL (ref 0.00–0.07)
Basophils Absolute: 0 10*3/uL (ref 0.0–0.1)
Basophils Relative: 0 %
Eosinophils Absolute: 0.1 10*3/uL (ref 0.0–0.5)
Eosinophils Relative: 2 %
HCT: 39.8 % (ref 36.0–46.0)
Hemoglobin: 12.4 g/dL (ref 12.0–15.0)
Immature Granulocytes: 0 %
Lymphocytes Relative: 35 %
Lymphs Abs: 1.6 10*3/uL (ref 0.7–4.0)
MCH: 24.8 pg — ABNORMAL LOW (ref 26.0–34.0)
MCHC: 31.2 g/dL (ref 30.0–36.0)
MCV: 79.4 fL — ABNORMAL LOW (ref 80.0–100.0)
Monocytes Absolute: 0.5 10*3/uL (ref 0.1–1.0)
Monocytes Relative: 12 %
Neutro Abs: 2.3 10*3/uL (ref 1.7–7.7)
Neutrophils Relative %: 51 %
Platelet Count: 304 10*3/uL (ref 150–400)
RBC: 5.01 MIL/uL (ref 3.87–5.11)
RDW: 18.2 % — ABNORMAL HIGH (ref 11.5–15.5)
WBC Count: 4.5 10*3/uL (ref 4.0–10.5)
nRBC: 0 % (ref 0.0–0.2)

## 2023-03-31 LAB — RETICULOCYTES
Immature Retic Fract: 13.6 % (ref 2.3–15.9)
RBC.: 5.02 MIL/uL (ref 3.87–5.11)
Retic Count, Absolute: 62.8 10*3/uL (ref 19.0–186.0)
Retic Ct Pct: 1.3 % (ref 0.4–3.1)

## 2023-03-31 LAB — CMP (CANCER CENTER ONLY)
ALT: 18 U/L (ref 0–44)
AST: 19 U/L (ref 15–41)
Albumin: 4.3 g/dL (ref 3.5–5.0)
Alkaline Phosphatase: 116 U/L (ref 38–126)
Anion gap: 8 (ref 5–15)
BUN: 10 mg/dL (ref 8–23)
CO2: 33 mmol/L — ABNORMAL HIGH (ref 22–32)
Calcium: 9.3 mg/dL (ref 8.9–10.3)
Chloride: 98 mmol/L (ref 98–111)
Creatinine: 1 mg/dL (ref 0.44–1.00)
GFR, Estimated: >60 mL/min (ref >60)
Glucose, Bld: 88 mg/dL (ref 70–99)
Potassium: 3.5 mmol/L (ref 3.5–5.1)
Sodium: 139 mmol/L (ref 135–145)
Total Bilirubin: 0.4 mg/dL (ref 0.0–1.2)
Total Protein: 7.6 g/dL (ref 6.5–8.1)

## 2023-03-31 LAB — FERRITIN: Ferritin: 9 ng/mL — ABNORMAL LOW (ref 11–307)

## 2023-03-31 LAB — IRON AND IRON BINDING CAPACITY (CC-WL,HP ONLY)
Iron: 113 ug/dL (ref 28–170)
Saturation Ratios: 21 % (ref 10.4–31.8)
TIBC: 543 ug/dL — ABNORMAL HIGH (ref 250–450)
UIBC: 430 ug/dL (ref 148–442)

## 2023-03-31 LAB — LACTATE DEHYDROGENASE: LDH: 166 U/L (ref 98–192)

## 2023-03-31 NOTE — Progress Notes (Signed)
 Hematology/Oncology Consultation   Name: Latasha Evans      MRN: [id number]    Location: Room/bed info not found  Date: 03/31/2023 Time:1:55 PM   REFERRING PHYSICIAN:  Noelle Redmon, PA  REASON FOR CONSULT:  Iron deficiency anemia    DIAGNOSIS: Iron deficiency anemia   HISTORY OF PRESENT ILLNESS:  Latasha Evans is a pleasant 63 yo African American female with history of IDA. Hgb stable with slightly decreased MCV.  Iron studies thalassemia work up are pending.  She has been taking oral iron daily.  She has occasional fatigue, lightheadedness, numbness and tingling in her hands and right foot.  No blood loss noted. No abnormal bruising, no petechiae.  She hs not had a cycle since having her son 21 years ago. She also has a daughter.  She miscarried twice, once with twins.  She is on Methotrexate and Cosentyx for RA.  She states that her colonoscopy is up to date and negative.  She is due for her mammogram this year. Last done in 2021 was negative.  No personal history of cancer. Her brother and paternal grandmother had history of lymphoma.  No diabetes or thyroid disease.  No fever, chills, n/v, cough, rash, SOB, chest pain, palpitations, abdominal pain or changes in bowel or bladder habits.  She started taking hydrochlorothiazide daily to help reduce fluid retention.  No falls or syncope reported.  No smoking or recreational drug use.  She has the occasional glass of wine socially.  Appetite and hydration are good. Weight is stable at 247 lbs.  She enjoys walking for exercise.  She owns and operate 3 group homes.   ROS: All other 10 point review of systems is negative.   PAST MEDICAL HISTORY:   Past Medical History:  Diagnosis Date   ADD (attention deficit disorder)    Allergy    Anemia    before menopause   Anxiety    Arthritis    Dysrhythmia    RBBB   Toothache    broken molar - per dentist needs root canal    ALLERGIES: Allergies  Allergen Reactions    Codeine Nausea And Vomiting   Adhesive [Tape] Itching and Rash      MEDICATIONS:  Current Outpatient Medications on File Prior to Visit  Medication Sig Dispense Refill   amphetamine-dextroamphetamine (ADDERALL XR) 25 MG 24 hr capsule Take 50 mg by mouth every morning.     b complex vitamins tablet Take 1 tablet by mouth daily.     CALCIUM CITRATE-VITAMIN D3 PO Take 4 tablets by mouth 2 (two) times daily.     COSENTYX SENSOREADY, 300 MG, 150 MG/ML SOAJ Inject into the skin every 28 (twenty-eight) days.     Flaxseed, Linseed, (FLAX SEEDS PO) Take 1 tablet by mouth daily.      fluticasone (FLONASE) 50 MCG/ACT nasal spray Place 2 sprays into the nose daily as needed for allergies.     glucosamine-chondroitin 500-400 MG tablet Take 3 tablets by mouth every morning.     hydrochlorothiazide (HYDRODIURIL) 25 MG tablet Take 1 tablet by mouth daily.     HYDROcodone-acetaminophen (NORCO/VICODIN) 5-325 MG tablet Take 1 tablet by mouth every 6 (six) hours as needed. for pain  0   indomethacin (INDOCIN SR) 75 MG CR capsule 1 (ONE) CAPSULE ONCE A DAY     Iron, Ferrous Sulfate, 325 (65 Fe) MG TABS Take 1 tablet by mouth 2 (two) times daily.     meclizine (ANTIVERT) 25 MG tablet Take  50 mg by mouth 3 (three) times daily as needed for dizziness.     meloxicam (MOBIC) 15 MG tablet Take 15 mg by mouth daily as needed for pain.      methotrexate (RHEUMATREX) 2.5 MG tablet TAKE 6 TABLETS BY MOUTH ONCE WEEKLY FOR 30 DAYS     Multiple Vitamin (MULTIVITAMIN WITH MINERALS) TABS Take 1 tablet by mouth at bedtime.     Omega-3 Fatty Acids (FISH OIL PO) Take 1 capsule by mouth daily.      sertraline (ZOLOFT) 100 MG tablet Take 1 tablet by mouth daily.     tirzepatide (ZEPBOUND) 2.5 MG/0.5ML injection vial Inject 2.5 mg into the skin once a week. Injects on Tuesdays     tiZANidine (ZANAFLEX) 4 MG capsule Take 1 capsule (4 mg total) by mouth 3 (three) times daily. Muscle spasms (Patient taking differently: Take 4 mg by  mouth 3 (three) times daily as needed for muscle spasms. Muscle spasms) 50 capsule 0   vitamin A 78295 UNIT capsule Take 10,000 Units by mouth daily.     vitamin C (ASCORBIC ACID) 500 MG tablet Take 500 mg by mouth at bedtime.     vitamin E 400 UNIT capsule Take 400 Units by mouth daily.     No current facility-administered medications on file prior to visit.     PAST SURGICAL HISTORY Past Surgical History:  Procedure Laterality Date   CHOLECYSTECTOMY     FOOT SURGERY Right    hammertoe correction with repair fracture second toe   HEMORROIDECTOMY     KNEE SURGERY Right    x3 2012, 2013, 2014   PARTIAL KNEE ARTHROPLASTY Right 05/07/2012   Procedure: RIGHT LATERAL UNICOMPARTMENTAL KNEE;  Surgeon: [name], MD;  Location: [facility];  Service: Orthopedics;  Laterality: Right;   ROUX-EN-Y GASTRIC BYPASS      FAMILY HISTORY: Family History  Problem Relation Age of Onset   Colon cancer Neg Hx    Esophageal cancer Neg Hx    Stomach cancer Neg Hx    Rectal cancer Neg Hx     SOCIAL HISTORY:  reports that she has never smoked. She has never used smokeless tobacco. She reports current alcohol use. She reports that she does not use drugs.  PERFORMANCE STATUS: The patient's performance status is 1 - Symptomatic but completely ambulatory  PHYSICAL EXAM: Most Recent Vital Signs: Blood pressure 134/71, pulse 75, temperature 98.4 F (36.9 C), temperature source Oral, resp. rate 18, height 5\' 10"  (1.778 m), weight 247 lb (112 kg), SpO2 100%. BP 134/71 (BP Location: Left Arm, Patient Position: Sitting)   Pulse 75   Temp 98.4 F (36.9 C) (Oral)   Resp 18   Ht 5\' 10"  (1.778 m)   Wt 247 lb (112 kg)   SpO2 100%   BMI 35.44 kg/m   General Appearance:    Alert, cooperative, no distress, appears stated age  Head:    Normocephalic, without obvious abnormality, atraumatic  Eyes:    PERRL, conjunctiva/corneas clear, EOM's intact, fundi    benign, both eyes        Throat:   Lips, mucosa,  and tongue normal; teeth and gums normal  Neck:   Supple, symmetrical, trachea midline, no adenopathy;    thyroid:  no enlargement/tenderness/nodules; no carotid   bruit or JVD  Back:     Symmetric, no curvature, ROM normal, no CVA tenderness  Lungs:     Clear to auscultation bilaterally, respirations unlabored  Chest Wall:  No tenderness or deformity   Heart:    Regular rate and rhythm, S1 and S2 normal, no murmur, rub   or gallop     Abdomen:     Soft, non-tender, bowel sounds active all four quadrants,    no masses, no organomegaly        Extremities:   Extremities normal, atraumatic, no cyanosis or edema  Pulses:   2+ and symmetric all extremities  Skin:   Skin color, texture, turgor normal, no rashes or lesions  Lymph nodes:   Cervical, supraclavicular, and axillary nodes normal  Neurologic:   CNII-XII intact, normal strength, sensation and reflexes    throughout    LABORATORY DATA:  Results for orders placed or performed in visit on 03/31/23 (from the past 48 hours)  CBC with Differential (Cancer Center Only)     Status: Abnormal   Collection Time: 03/31/23 10:27 AM  Result Value Ref Range   WBC Count 4.5 4.0 - 10.5 K/uL   RBC 5.01 3.87 - 5.11 MIL/uL   Hemoglobin 12.4 12.0 - 15.0 g/dL   HCT 78.2 95.6 - 21.3 %   MCV 79.4 (L) 80.0 - 100.0 fL   MCH 24.8 (L) 26.0 - 34.0 pg   MCHC 31.2 30.0 - 36.0 g/dL   RDW 08.6 (H) 57.8 - 46.9 %   Platelet Count 304 150 - 400 K/uL   nRBC 0.0 0.0 - 0.2 %   Neutrophils Relative % 51 %   Neutro Abs 2.3 1.7 - 7.7 K/uL   Lymphocytes Relative 35 %   Lymphs Abs 1.6 0.7 - 4.0 K/uL   Monocytes Relative 12 %   Monocytes Absolute 0.5 0.1 - 1.0 K/uL   Eosinophils Relative 2 %   Eosinophils Absolute 0.1 0.0 - 0.5 K/uL   Basophils Relative 0 %   Basophils Absolute 0.0 0.0 - 0.1 K/uL   Immature Granulocytes 0 %   Abs Immature Granulocytes 0.01 0.00 - 0.07 K/uL    Comment: Performed at [facility] at [facility], [address] [place], [place] [address]  CMP (Cancer Center only)     Status: Abnormal   Collection Time: 03/31/23 10:27 AM  Result Value Ref Range   Sodium 139 135 - 145 mmol/L   Potassium 3.5 3.5 - 5.1 mmol/L   Chloride 98 98 - 111 mmol/L   CO2 33 (H) 22 - 32 mmol/L   Glucose, Bld 88 70 - 99 mg/dL    Comment: Glucose reference range applies only to samples taken after fasting for at least 8 hours.   BUN 10 8 - 23 mg/dL   Creatinine 8.41 3.24 - 1.00 mg/dL   Calcium 9.3 8.9 - 40.1 mg/dL   Total Protein 7.6 6.5 - 8.1 g/dL   Albumin 4.3 3.5 - 5.0 g/dL   AST 19 15 - 41 U/L   ALT 18 0 - 44 U/L   Alkaline Phosphatase 116 38 - 126 U/L   Total Bilirubin 0.4 0.0 - 1.2 mg/dL   GFR, Estimated >02 >72 mL/min    Comment: (NOTE) Calculated using the CKD-EPI Creatinine Equation (2021)    Anion gap 8 5 - 15    Comment: Performed at [facility] at [facility], [address], [place], [place] [address]  Iron and Iron Binding Capacity ([facility] only)     Status: Abnormal   Collection Time: 03/31/23 10:27 AM  Result Value Ref Range   Iron  113 28 - 170 ug/dL   TIBC 409 (H) 811 - 914 ug/dL   Saturation Ratios 21 10.4 - 31.8 %   UIBC 430 148 - 442 ug/dL    Comment: Performed at [facility], [address], [place], [place] [address]  Lactate dehydrogenase (LDH)     Status: None   Collection Time: 03/31/23 10:28 AM  Result Value Ref Range   LDH 166 98 - 192 U/L    Comment: Performed at [facility] at [facility], [address], [place], [place] [address]  Reticulocytes     Status: None   Collection Time: 03/31/23 10:28 AM  Result Value Ref Range   Retic Ct Pct 1.3 0.4 - 3.1 %   RBC. 5.02 3.87 - 5.11 MIL/uL   Retic Count, Absolute 62.8 19.0 - 186.0 K/uL   Immature Retic Fract 13.6 2.3 - 15.9 %    Comment: Performed at [facility] at [facility], [address], [place], [place]  [address]      RADIOGRAPHY: No results found.     PATHOLOGY: None   ASSESSMENT/PLAN: Ms. Vary is a pleasant 63 yo [place] American female with history of IDA.  Anemia work up is pending.  We will add IV iron and folic acid if needed.  Follow-up in 3 months.   All questions were answered. The patient knows to call the clinic with any problems, questions or concerns. We can certainly see the patient much sooner if necessary.   Eileen Stanford, NP

## 2023-04-01 LAB — ERYTHROPOIETIN: Erythropoietin: 17.5 m[IU]/mL (ref 2.6–18.5)

## 2023-04-03 ENCOUNTER — Telehealth: Payer: Self-pay | Admitting: *Deleted

## 2023-04-03 LAB — HGB FRACTIONATION CASCADE
Hgb A2: 2.3 % (ref 1.8–3.2)
Hgb A: 97.7 % (ref 96.4–98.8)
Hgb F: 0 % (ref 0.0–2.0)
Hgb S: 0 %

## 2023-04-03 NOTE — Telephone Encounter (Signed)
 PAtient called asking about her labwork.  Spoke with Eileen Stanford NP who wants patient to Keep taking her oral iron supplement daily and we will recheck at follow-up!Marland Kitchen Still waiting on her alpha thalassemia test to come back. Patient appreciative of return call back.  All questions answered.

## 2023-04-07 LAB — ALPHA-THALASSEMIA GENOTYPR

## 2023-04-10 ENCOUNTER — Other Ambulatory Visit: Payer: Self-pay | Admitting: Family

## 2023-04-10 DIAGNOSIS — D563 Thalassemia minor: Secondary | ICD-10-CM

## 2023-04-10 MED ORDER — FOLIC ACID 1 MG PO TABS: 1.0000 mg | ORAL_TABLET | Freq: Every day | ORAL | 11 refills | Status: AC

## 2023-04-11 ENCOUNTER — Telehealth: Payer: Self-pay

## 2023-04-11 NOTE — Telephone Encounter (Signed)
 Have attempted to call and informed patient of lab results x3. LM for patient to call back.

## 2023-04-11 NOTE — Telephone Encounter (Signed)
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 Treatment is daily folic acid. I sent in a prescription for this :) ----- Message ----- From: Interface, Lab In [place] Sent: 03/31/2023  10:42 AM EDT To: Erenest Blank, NP

## 2023-04-11 NOTE — Telephone Encounter (Signed)
 LMTCB for results.

## 2023-05-25 DIAGNOSIS — M459 Ankylosing spondylitis of unspecified sites in spine: Secondary | ICD-10-CM | POA: Diagnosis not present

## 2023-05-25 DIAGNOSIS — M7661 Achilles tendinitis, right leg: Secondary | ICD-10-CM | POA: Diagnosis not present

## 2023-05-25 DIAGNOSIS — M1991 Primary osteoarthritis, unspecified site: Secondary | ICD-10-CM | POA: Diagnosis not present

## 2023-05-25 DIAGNOSIS — Z79899 Other long term (current) drug therapy: Secondary | ICD-10-CM | POA: Diagnosis not present

## 2023-05-26 DIAGNOSIS — M459 Ankylosing spondylitis of unspecified sites in spine: Secondary | ICD-10-CM | POA: Diagnosis not present

## 2023-06-05 DIAGNOSIS — F339 Major depressive disorder, recurrent, unspecified: Secondary | ICD-10-CM | POA: Diagnosis not present

## 2023-06-05 DIAGNOSIS — F900 Attention-deficit hyperactivity disorder, predominantly inattentive type: Secondary | ICD-10-CM | POA: Diagnosis not present

## 2023-06-05 DIAGNOSIS — G4700 Insomnia, unspecified: Secondary | ICD-10-CM | POA: Diagnosis not present

## 2023-06-05 DIAGNOSIS — I10 Essential (primary) hypertension: Secondary | ICD-10-CM | POA: Diagnosis not present

## 2023-06-05 DIAGNOSIS — F419 Anxiety disorder, unspecified: Secondary | ICD-10-CM | POA: Diagnosis not present

## 2023-07-03 ENCOUNTER — Inpatient Hospital Stay: Admitting: Family

## 2023-07-03 ENCOUNTER — Inpatient Hospital Stay: Attending: Family

## 2023-07-03 ENCOUNTER — Other Ambulatory Visit: Payer: Self-pay | Admitting: Family

## 2023-07-03 DIAGNOSIS — D509 Iron deficiency anemia, unspecified: Secondary | ICD-10-CM

## 2023-07-03 DIAGNOSIS — D563 Thalassemia minor: Secondary | ICD-10-CM

## 2023-07-12 ENCOUNTER — Telehealth: Payer: Self-pay | Admitting: Family

## 2023-07-12 NOTE — Telephone Encounter (Signed)
 no vm unable to lvm for patient to return call for rescheduling miseed appt on 06/16.

## 2023-07-24 ENCOUNTER — Telehealth: Payer: Self-pay | Admitting: Family

## 2023-07-24 NOTE — Telephone Encounter (Signed)
lvm for patient to return call for scheduling

## 2023-08-01 ENCOUNTER — Inpatient Hospital Stay: Attending: Family

## 2023-08-01 ENCOUNTER — Inpatient Hospital Stay: Admitting: Family

## 2023-08-18 ENCOUNTER — Inpatient Hospital Stay: Admitting: Family

## 2023-08-18 ENCOUNTER — Inpatient Hospital Stay: Attending: Family

## 2023-08-18 DIAGNOSIS — D563 Thalassemia minor: Secondary | ICD-10-CM | POA: Insufficient documentation

## 2023-08-18 DIAGNOSIS — D509 Iron deficiency anemia, unspecified: Secondary | ICD-10-CM | POA: Insufficient documentation

## 2023-08-29 ENCOUNTER — Inpatient Hospital Stay

## 2023-08-29 ENCOUNTER — Inpatient Hospital Stay: Admitting: Family

## 2023-08-29 VITALS — BP 147/59 | HR 68 | Temp 98.6°F | Resp 17 | Ht 70.0 in | Wt 251.8 lb

## 2023-08-29 DIAGNOSIS — D563 Thalassemia minor: Secondary | ICD-10-CM

## 2023-08-29 DIAGNOSIS — D509 Iron deficiency anemia, unspecified: Secondary | ICD-10-CM

## 2023-08-29 LAB — CBC WITH DIFFERENTIAL (CANCER CENTER ONLY)
Abs Immature Granulocytes: 0.02 K/uL (ref 0.00–0.07)
Basophils Absolute: 0 K/uL (ref 0.0–0.1)
Basophils Relative: 1 %
Eosinophils Absolute: 0.1 K/uL (ref 0.0–0.5)
Eosinophils Relative: 2 %
HCT: 40.4 % (ref 36.0–46.0)
Hemoglobin: 12.6 g/dL (ref 12.0–15.0)
Immature Granulocytes: 1 %
Lymphocytes Relative: 36 %
Lymphs Abs: 1.5 K/uL (ref 0.7–4.0)
MCH: 26.2 pg (ref 26.0–34.0)
MCHC: 31.2 g/dL (ref 30.0–36.0)
MCV: 84 fL (ref 80.0–100.0)
Monocytes Absolute: 0.4 K/uL (ref 0.1–1.0)
Monocytes Relative: 8 %
Neutro Abs: 2.3 K/uL (ref 1.7–7.7)
Neutrophils Relative %: 52 %
Platelet Count: 271 K/uL (ref 150–400)
RBC: 4.81 MIL/uL (ref 3.87–5.11)
RDW: 17 % — ABNORMAL HIGH (ref 11.5–15.5)
WBC Count: 4.2 K/uL (ref 4.0–10.5)
nRBC: 0 % (ref 0.0–0.2)

## 2023-08-29 LAB — IRON AND IRON BINDING CAPACITY (CC-WL,HP ONLY)
Iron: 56 ug/dL (ref 28–170)
Saturation Ratios: 12 % (ref 10.4–31.8)
TIBC: 473 ug/dL — ABNORMAL HIGH (ref 250–450)
UIBC: 417 ug/dL

## 2023-08-29 LAB — RETICULOCYTES
Immature Retic Fract: 7.9 % (ref 2.3–15.9)
RBC.: 4.82 MIL/uL (ref 3.87–5.11)
Retic Count, Absolute: 76.6 K/uL (ref 19.0–186.0)
Retic Ct Pct: 1.6 % (ref 0.4–3.1)

## 2023-08-29 LAB — FERRITIN: Ferritin: 19 ng/mL (ref 11–307)

## 2023-08-29 NOTE — Progress Notes (Signed)
 Hematology and Oncology Follow Up Visit  Latasha Evans [id number] [birth year] 63 y.o. 08/29/2023   Principle Diagnosis:  Iron deficiency anemia   Current Therapy:   Oral iron supplement PO Daily Folic acid  1 mg PO Daily    Interim History:  Latasha Evans is here today for follow-up. She is feeling fatigued. She is not sure if this is medication related are if her iron is down.  She has been taking 2 mg of folic acid  daily along with her daily iron supplement.  No blood loss noted. No bruising or petechiae.  No fever, chills, n/v, cough, rash, dizziness, SOB, chest pain, palpitations, abdominal pain or changes in bowel or bladder habits.  Join pain and swelling and tingling in her hands from RA wax and wane.  No falls or syncope reported.  Appetite and hydration are good. Weight is stable at 251 lbs.   ECOG Performance Status: 1 - Symptomatic but completely ambulatory  Medications:  Allergies as of 08/29/2023       Reactions   Codeine Nausea And Vomiting   Adhesive [tape] Itching, Rash        Medication List        Accurate as of August 29, 2023  1:03 PM. If you have any questions, ask your nurse or doctor.          amphetamine -dextroamphetamine 25 MG 24 hr capsule Commonly known as: ADDERALL XR Take 50 mg by mouth every morning.   ascorbic acid 500 MG tablet Commonly known as: VITAMIN C Take 500 mg by mouth at bedtime.   b complex vitamins tablet Take 1 tablet by mouth daily.   CALCIUM CITRATE-VITAMIN D3 PO Take 4 tablets by mouth 2 (two) times daily.   Cosentyx Sensoready (300 MG) 150 MG/ML Soaj Generic drug: Secukinumab (300 MG Dose) Inject into the skin every 28 (twenty-eight) days.   FISH OIL PO Take 1 capsule by mouth daily.   FLAX SEEDS PO Take 1 tablet by mouth daily.   fluticasone  50 MCG/ACT nasal spray Commonly known as: FLONASE  Place 2 sprays into the nose daily as needed for allergies.   folic acid  1 MG tablet Commonly known  as: FOLVITE  Take 1 tablet (1 mg total) by mouth daily.   glucosamine-chondroitin 500-400 MG tablet Take 3 tablets by mouth every morning.   hydrochlorothiazide 25 MG tablet Commonly known as: HYDRODIURIL Take 1 tablet by mouth daily.   HYDROcodone -acetaminophen  5-325 MG tablet Commonly known as: NORCO/VICODIN Take 1 tablet by mouth every 6 (six) hours as needed. for pain   indomethacin 75 MG CR capsule Commonly known as: INDOCIN SR 1 (ONE) CAPSULE ONCE A DAY   Iron (Ferrous Sulfate ) 325 (65 Fe) MG Tabs Take 1 tablet by mouth 2 (two) times daily.   meclizine 25 MG tablet Commonly known as: ANTIVERT Take 50 mg by mouth 3 (three) times daily as needed for dizziness.   meloxicam 15 MG tablet Commonly known as: MOBIC Take 15 mg by mouth daily as needed for pain.   methotrexate 2.5 MG tablet Commonly known as: RHEUMATREX TAKE 6 TABLETS BY MOUTH ONCE WEEKLY FOR 30 DAYS   multivitamin with minerals Tabs tablet Take 1 tablet by mouth at bedtime.   sertraline  100 MG tablet Commonly known as: ZOLOFT  Take 1 tablet by mouth daily.   tirzepatide 2.5 MG/0.5ML injection vial Commonly known as: ZEPBOUND Inject 2.5 mg into the skin once a week. Injects on Tuesdays   tiZANidine  4 MG capsule Commonly known as: Zanaflex   Take 1 capsule (4 mg total) by mouth 3 (three) times daily. Muscle spasms What changed:  when to take this reasons to take this   vitamin A  10000 UNIT capsule Take 10,000 Units by mouth daily.   vitamin E 180 MG (400 UNITS) capsule Take 400 Units by mouth daily.        Allergies:  Allergies  Allergen Reactions   Codeine Nausea And Vomiting   Adhesive [Tape] Itching and Rash    Past Medical History, Surgical history, Social history, and Family History were reviewed and updated.  Review of Systems: All other 10 point review of systems is negative.   Physical Exam:  vitals were not taken for this visit.   Wt Readings from Last 3 Encounters:   03/31/23 247 lb (112 kg)  09/05/13 241 lb 3.2 oz (109.4 kg)  05/07/12 251 lb (113.9 kg)    Ocular: Sclerae unicteric, pupils equal, round and reactive to light Ear-nose-throat: Oropharynx clear, dentition fair Lymphatic: No cervical or supraclavicular adenopathy Lungs no rales or rhonchi, good excursion bilaterally Heart regular rate and rhythm, no murmur appreciated Abd soft, nontender, positive bowel sounds MSK no focal spinal tenderness, no joint edema Neuro: non-focal, well-oriented, appropriate affect Breasts: Deferred   Lab Results  Component Value Date   WBC 4.5 03/31/2023   HGB 12.4 03/31/2023   HCT 39.8 03/31/2023   MCV 79.4 (L) 03/31/2023   PLT 304 03/31/2023   Lab Results  Component Value Date   FERRITIN 9 (L) 03/31/2023   IRON 113 03/31/2023   TIBC 543 (H) 03/31/2023   UIBC 430 03/31/2023   IRONPCTSAT 21 03/31/2023   Lab Results  Component Value Date   RETICCTPCT 1.3 03/31/2023   RBC 5.02 03/31/2023   RETICCTABS 45.0 06/17/2005   No results found for: KPAFRELGTCHN, LAMBDASER, KAPLAMBRATIO No results found for: IGGSERUM, IGA, IGMSERUM No results found for: STEPHANY RINGS, A1GS, A2GS, EARLA JOANNIE KNIGHTS, MSPIKE, SPEI   Chemistry      Component Value Date/Time   NA 139 03/31/2023 1027   K 3.5 03/31/2023 1027   CL 98 03/31/2023 1027   CO2 33 (H) 03/31/2023 1027   BUN 10 03/31/2023 1027   CREATININE 1.00 03/31/2023 1027      Component Value Date/Time   CALCIUM 9.3 03/31/2023 1027   ALKPHOS 116 03/31/2023 1027   AST 19 03/31/2023 1027   ALT 18 03/31/2023 1027   BILITOT 0.4 03/31/2023 1027       Impression and Plan: Latasha Evans is a pleasant 63 yo African American female with history of IDA as well as the alpha thalassemia minor trait.  Iron studies are pending.  Continue daily PO iron and folic acid .  Follow-up in 6 months.   Lauraine Pepper, NP 8/12/20251:03 PM

## 2023-08-30 ENCOUNTER — Ambulatory Visit: Payer: Self-pay

## 2023-08-30 DIAGNOSIS — M459 Ankylosing spondylitis of unspecified sites in spine: Secondary | ICD-10-CM | POA: Diagnosis not present

## 2023-08-30 DIAGNOSIS — M7661 Achilles tendinitis, right leg: Secondary | ICD-10-CM | POA: Diagnosis not present

## 2023-08-30 DIAGNOSIS — M5459 Other low back pain: Secondary | ICD-10-CM | POA: Diagnosis not present

## 2023-08-30 DIAGNOSIS — Z79899 Other long term (current) drug therapy: Secondary | ICD-10-CM | POA: Diagnosis not present

## 2023-08-30 DIAGNOSIS — M1991 Primary osteoarthritis, unspecified site: Secondary | ICD-10-CM | POA: Diagnosis not present

## 2023-09-05 DIAGNOSIS — M5459 Other low back pain: Secondary | ICD-10-CM | POA: Diagnosis not present

## 2023-09-07 DIAGNOSIS — M5459 Other low back pain: Secondary | ICD-10-CM | POA: Diagnosis not present

## 2023-09-12 DIAGNOSIS — M79671 Pain in right foot: Secondary | ICD-10-CM | POA: Diagnosis not present

## 2023-09-12 DIAGNOSIS — M25571 Pain in right ankle and joints of right foot: Secondary | ICD-10-CM | POA: Diagnosis not present

## 2023-09-12 DIAGNOSIS — S93401A Sprain of unspecified ligament of right ankle, initial encounter: Secondary | ICD-10-CM | POA: Diagnosis not present

## 2023-09-12 DIAGNOSIS — X501XXA Overexertion from prolonged static or awkward postures, initial encounter: Secondary | ICD-10-CM | POA: Diagnosis not present

## 2023-09-14 DIAGNOSIS — M5459 Other low back pain: Secondary | ICD-10-CM | POA: Diagnosis not present

## 2023-09-19 DIAGNOSIS — M5459 Other low back pain: Secondary | ICD-10-CM | POA: Diagnosis not present

## 2023-10-03 DIAGNOSIS — M5459 Other low back pain: Secondary | ICD-10-CM | POA: Diagnosis not present

## 2023-10-16 DIAGNOSIS — M79672 Pain in left foot: Secondary | ICD-10-CM | POA: Diagnosis not present

## 2023-10-16 DIAGNOSIS — M67874 Other specified disorders of tendon, left ankle and foot: Secondary | ICD-10-CM | POA: Diagnosis not present

## 2023-10-26 DIAGNOSIS — M7662 Achilles tendinitis, left leg: Secondary | ICD-10-CM | POA: Diagnosis not present

## 2023-11-17 DIAGNOSIS — M7662 Achilles tendinitis, left leg: Secondary | ICD-10-CM | POA: Diagnosis not present

## 2023-11-30 DIAGNOSIS — Z0000 Encounter for general adult medical examination without abnormal findings: Secondary | ICD-10-CM | POA: Diagnosis not present

## 2023-11-30 DIAGNOSIS — M7661 Achilles tendinitis, right leg: Secondary | ICD-10-CM | POA: Diagnosis not present

## 2023-11-30 DIAGNOSIS — M1991 Primary osteoarthritis, unspecified site: Secondary | ICD-10-CM | POA: Diagnosis not present

## 2023-11-30 DIAGNOSIS — D509 Iron deficiency anemia, unspecified: Secondary | ICD-10-CM | POA: Diagnosis not present

## 2023-11-30 DIAGNOSIS — F900 Attention-deficit hyperactivity disorder, predominantly inattentive type: Secondary | ICD-10-CM | POA: Diagnosis not present

## 2023-11-30 DIAGNOSIS — I10 Essential (primary) hypertension: Secondary | ICD-10-CM | POA: Diagnosis not present

## 2023-11-30 DIAGNOSIS — Z23 Encounter for immunization: Secondary | ICD-10-CM | POA: Diagnosis not present

## 2023-11-30 DIAGNOSIS — F419 Anxiety disorder, unspecified: Secondary | ICD-10-CM | POA: Diagnosis not present

## 2023-11-30 DIAGNOSIS — Z79899 Other long term (current) drug therapy: Secondary | ICD-10-CM | POA: Diagnosis not present

## 2023-11-30 DIAGNOSIS — M459 Ankylosing spondylitis of unspecified sites in spine: Secondary | ICD-10-CM | POA: Diagnosis not present

## 2023-12-02 DIAGNOSIS — M25572 Pain in left ankle and joints of left foot: Secondary | ICD-10-CM | POA: Diagnosis not present

## 2023-12-08 DIAGNOSIS — M7662 Achilles tendinitis, left leg: Secondary | ICD-10-CM | POA: Diagnosis not present

## 2024-03-01 ENCOUNTER — Inpatient Hospital Stay: Attending: Family

## 2024-03-01 ENCOUNTER — Ambulatory Visit: Admitting: Family
